# Patient Record
Sex: Female | Born: 1995 | Hispanic: No | Marital: Married | Smoking: Never smoker
Health system: Southern US, Community
[De-identification: ages and names within clinical notes are randomized; demographics above are authoritative.]

## PROBLEM LIST (undated history)

## (undated) DIAGNOSIS — Z789 Other specified health status: Secondary | ICD-10-CM

## (undated) HISTORY — DX: Other specified health status: Z78.9

## (undated) HISTORY — PX: TONSILLECTOMY: SHX5217

---

## 2019-12-20 ENCOUNTER — Encounter (HOSPITAL_COMMUNITY): Payer: Self-pay | Admitting: Physician Assistant

## 2019-12-20 ENCOUNTER — Emergency Department (HOSPITAL_COMMUNITY): Payer: Medicaid Other

## 2019-12-20 ENCOUNTER — Emergency Department (HOSPITAL_COMMUNITY)
Admission: EM | Admit: 2019-12-20 | Discharge: 2019-12-20 | Disposition: A | Discharge status: Home / Self Care | Payer: Medicaid Other | Attending: Emergency Medicine | Admitting: Emergency Medicine

## 2019-12-20 DIAGNOSIS — J029 Acute pharyngitis, unspecified: Secondary | ICD-10-CM | POA: Insufficient documentation

## 2019-12-20 DIAGNOSIS — R070 Pain in throat: Secondary | ICD-10-CM | POA: Diagnosis present

## 2019-12-20 DIAGNOSIS — Z20822 Contact with and (suspected) exposure to covid-19: Secondary | ICD-10-CM | POA: Insufficient documentation

## 2019-12-20 DIAGNOSIS — R519 Headache, unspecified: Secondary | ICD-10-CM | POA: Diagnosis not present

## 2019-12-20 DIAGNOSIS — E860 Dehydration: Secondary | ICD-10-CM | POA: Insufficient documentation

## 2019-12-20 DIAGNOSIS — R590 Localized enlarged lymph nodes: Secondary | ICD-10-CM | POA: Diagnosis not present

## 2019-12-20 DIAGNOSIS — J020 Streptococcal pharyngitis: Secondary | ICD-10-CM

## 2019-12-20 LAB — RESP PANEL BY RT-PCR (FLU A&B, COVID) ARPGX2
Influenza A by PCR: NEGATIVE
Influenza B by PCR: NEGATIVE
SARS Coronavirus 2 by RT PCR: NEGATIVE

## 2019-12-20 LAB — GROUP A STREP BY PCR: Group A Strep by PCR: DETECTED — AB

## 2019-12-20 MED ORDER — DEXAMETHASONE SODIUM PHOSPHATE 10 MG/ML IJ SOLN
10.0000 mg | Freq: Once | INTRAMUSCULAR | Status: AC
  Administered 2019-12-20: 20:00:00 10 mg via INTRAMUSCULAR
  Filled 2019-12-20: qty 1

## 2019-12-20 MED ORDER — PENICILLIN G BENZATHINE 1200000 UNIT/2ML IM SUSP
1.2000 10*6.[IU] | Freq: Once | INTRAMUSCULAR | Status: AC
  Administered 2019-12-20: 20:00:00 1.2 10*6.[IU] via INTRAMUSCULAR
  Filled 2019-12-20: qty 2

## 2019-12-20 NOTE — ED Provider Notes (Signed)
[facility] EMERGENCY DEPARTMENT Provider Note   CSN: [id number] Arrival date & time: 12/20/19  1102     History Chief Complaint  Patient presents with  . Sore Throat  . Headache  . Generalized Body Aches    Jade Stafford is a 24 y.o. female.  HPI   24 year old female presenting the emergency department today for evaluation of sore throat ongoing for the last few days.  Is complaining of painful swallowing.  She also has generalized body aches.  Denies cough.  Has had a bit of a headache  History reviewed. No pertinent past medical history.  There are no problems to display for this patient.   History reviewed. No pertinent surgical history.   OB History   No obstetric history on file.     History reviewed. No pertinent family history.     Home Medications Prior to Admission medications   Not on File    Allergies    Patient has no known allergies.  Review of Systems   Review of Systems  Constitutional: Negative for fever.  HENT: Positive for sore throat. Negative for ear pain.   Eyes: Negative for visual disturbance.  Respiratory: Negative for cough and shortness of breath.   Cardiovascular: Negative for chest pain.  Gastrointestinal: Negative for abdominal pain and vomiting.  Genitourinary: Negative for dysuria and hematuria.  Musculoskeletal: Positive for myalgias.  Skin: Negative for rash.  Neurological: Positive for headaches.  All other systems reviewed and are negative.   Physical Exam Updated Vital Signs BP 103/64   Pulse (!) 111   Temp 99.6 F (37.6 C) (Oral)   Resp 17   LMP 12/01/2019   SpO2 100%   Physical Exam Vitals and nursing note reviewed.  Constitutional:      General: She is not in acute distress.    Appearance: She is well-developed and well-nourished.  HENT:     Head: Normocephalic and atraumatic.     Mouth/Throat:     Mouth: Mucous membranes are moist.     Pharynx: Posterior oropharyngeal erythema  present. No oropharyngeal exudate.     Tonsils: No tonsillar exudate or tonsillar abscesses. 0 on the right. 0 on the left.     Comments: S/p tonsillectomy Eyes:     Conjunctiva/sclera: Conjunctivae normal.  Cardiovascular:     Rate and Rhythm: Normal rate and regular rhythm.     Heart sounds: No murmur heard.   Pulmonary:     Effort: Pulmonary effort is normal. No respiratory distress.     Breath sounds: Normal breath sounds.  Abdominal:     Palpations: Abdomen is soft.     Tenderness: There is no abdominal tenderness.  Musculoskeletal:        General: No edema.     Cervical back: Neck supple.  Skin:    General: Skin is warm and dry.  Neurological:     Mental Status: She is alert.  Psychiatric:        Mood and Affect: Mood and affect normal.     ED Results / Procedures / Treatments   Labs (all labs ordered are listed, but only abnormal results are displayed) Labs Reviewed  GROUP A STREP BY PCR - Abnormal; Notable for the following components:      Result Value   Group A Strep by PCR DETECTED (*)    All other components within normal limits  RESP PANEL BY RT-PCR (FLU A&B, COVID) [id number]    EKG None  Radiology DG  Chest Portable 1 View  Result Date: 12/20/2019 CLINICAL DATA:  Fever and shortness of breath. EXAM: PORTABLE CHEST 1 VIEW COMPARISON:  None. FINDINGS: The heart size and mediastinal contours are within normal limits. Both lungs are clear. The visualized skeletal structures are unremarkable. IMPRESSION: No active disease. Electronically Signed   By: Obie Dredge M.D.   On: 12/20/2019 12:40    Procedures Procedures (including critical care time)  Medications Ordered in ED Medications  penicillin g benzathine (BICILLIN LA) 1200000 UNIT/2ML injection 1.2 Million Units (1.2 Million Units Intramuscular Given 12/20/19 1941)  dexamethasone (DECADRON) injection 10 mg (10 mg Intramuscular Given 12/20/19 1939)    ED Course  I have reviewed the triage vital  signs and the nursing notes.  Pertinent labs & imaging results that were available during my care of the patient were reviewed by me and considered in my medical decision making (see chart for details).    MDM Rules/Calculators/A&P                          Pt febrile with pharyngeal erythe,a cervical lymphadenopathy, & dysphagia; diagnosis of strep. Treated in the Ed with steroids  and PCN IM.  covid and cxr ordered in triage are negative. Pt appears mildly dehydrated, discussed importance of water rehydration. Presentation non concerning for PTA or infxn spread to soft tissue. No trismus or uvula deviation. Specific return precautions discussed. Pt able to drink water in ED without difficulty with intact air way. Recommended PCP follow up.    Final Clinical Impression(s) / ED Diagnoses Final diagnoses:  Strep pharyngitis    Rx / DC Orders ED Discharge Orders    None       [name] 12/20/19 1950    Terrilee Files, MD 12/20/19 2317

## 2019-12-20 NOTE — ED Triage Notes (Signed)
Pt here with fever, sore throat, body aches, shob and headache onset yesterday. Denies sick contacts.

## 2019-12-20 NOTE — Discharge Instructions (Signed)

## 2020-01-21 ENCOUNTER — Ambulatory Visit: Payer: Self-pay

## 2020-02-06 ENCOUNTER — Encounter: Payer: Self-pay | Admitting: Family Medicine

## 2020-02-06 ENCOUNTER — Ambulatory Visit: Payer: Medicaid Other | Attending: Family Medicine | Admitting: Family Medicine

## 2020-02-06 ENCOUNTER — Other Ambulatory Visit: Payer: Self-pay | Admitting: Pharmacist

## 2020-02-06 ENCOUNTER — Other Ambulatory Visit: Payer: Self-pay

## 2020-02-06 VITALS — BP 99/64 | HR 75 | Wt 135.0 lb

## 2020-02-06 DIAGNOSIS — H1013 Acute atopic conjunctivitis, bilateral: Secondary | ICD-10-CM

## 2020-02-06 DIAGNOSIS — Z1159 Encounter for screening for other viral diseases: Secondary | ICD-10-CM | POA: Diagnosis not present

## 2020-02-06 MED ORDER — OLOPATADINE HCL 0.2 % OP SOLN: OPHTHALMIC | 2 refills | Status: DC

## 2020-02-06 MED ORDER — CETIRIZINE HCL 10 MG PO TABS: 10.0000 mg | ORAL_TABLET | Freq: Every day | ORAL | 1 refills | Status: DC

## 2020-02-06 MED ORDER — OLOPATADINE HCL 0.1 % OP SOLN: 1.0000 [drp] | Freq: Two times a day (BID) | OPHTHALMIC | 2 refills | Status: DC

## 2020-02-06 NOTE — Patient Instructions (Signed)
Allergic Conjunctivitis, Adult  Allergic conjunctivitis is inflammation of the clear membrane (conjunctiva) that covers the white part of your eye and the inner surface of your eyelid. This condition can make your eye red or pink. It can also make your eye feel itchy. This condition cannot be spread from one person to another person (is not contagious). What are the causes? This condition is caused by allergens. These are things that can cause an allergic reaction in some people but not in others. Common allergens include:  Outdoor allergens, such as: ? Pollen, including pollen from grass and weeds. ? Mold. ? Car fumes.  Indoor allergens, such as: ? Dust. ? Smoke. ? Mold. ? Proteins in a pet's pee (urine), saliva, or dander. What increases the risk? You are more likely to develop this condition if you have a family history of these things:  Allergies.  Conditions that you get because of allergens, such as asthma or inflammation of the skin (eczema). What are the signs or symptoms? Symptoms of this condition include eyes that are:  Itchy.  Red.  Watery.  Puffy. Your eyes may also:  Sting or burn.  Have clear fluid draining from them.  Have thick mucus coming from them. How is this treated? This condition may be treated with:  Cold, wet cloths (cold compresses) to soothe itching and swelling.  Washing the face to remove allergens.  Eye drops. These may include: ? Eye drops that block allergies. ? Eye drops that reduce swelling and irritation. ? Steroid eye drops if other treatments have not worked.  Oral antihistamine medicines. These medicines lessen your allergies. You may need these if eye drops do not help or are difficult to use.   Follow these instructions at home: Eye care  Place a cool, clean washcloth on your eye for 10-20 minutes. Do this 3-4 times a day.  Do not touch or rub your eyes.  Do not wear contact lenses until the inflammation is gone.  Wear glasses instead.  Do not wear eye makeup until the inflammation is gone. General instructions  Try not to be around things that you are allergic to.  Take or apply over-the-counter and prescription medicines only as told by your doctor. These include any eye drops.  Drink enough fluid to keep your pee pale yellow.  Keep all follow-up visits as told by your doctor. This is important. Contact a doctor if:  Your symptoms get worse.  Your symptoms do not get better with treatment.  You have mild eye pain.  You are sensitive to light.  You have spots or blisters on your eyes.  You have pus coming from your eye.  You have a fever. Get help right away if:  You have redness, swelling, or other symptoms in only one eye.  You cannot see well.  You have other vision changes.  You have very bad eye pain. Summary  Allergic conjunctivitis is caused by allergens. It can make your eye red or pink, and it can make your eye feel itchy.  This condition cannot be spread from one person to another person (is not contagious).  Try not to be around things that you are allergic to.  Take or apply over-the-counter and prescription medicines only as told by your doctor. These include any eye drops.  Contact your doctor if your symptoms get worse or they do not get better with treatment. This information is not intended to replace advice given to you by your health care provider.   Make sure you discuss any questions you have with your health care provider. Document Revised: 11/12/2018 Document Reviewed: 11/12/2018 Elsevier Patient Education  2021 Elsevier Inc.  

## 2020-02-06 NOTE — Progress Notes (Signed)
Subjective:  Patient ID: Jade Stafford, female    DOB: 07/22/1995  Age: 25 y.o. MRN: [id number]  CC: New Patient (Initial Visit)   HPI Jade Stafford is a 25 year old female who relocated from [place] in 09/2019 and presents to establish care. Complains of itching, burning and redness of her eyes on and off chronically. While she was in [place] she used eye drops. Denies visual problems but sometimes has headaches. Sometimes has infra orbital swelling and at other times has discharge from her eyes. Denies presence of rhinorrhea, sore throat or postnasal drip.  She has no sinus pressure or pain. Past Medical History:  Diagnosis Date  . No pertinent past medical history     Past Surgical History:  Procedure Laterality Date  . TONSILLECTOMY      History reviewed. No pertinent family history.  No Known Allergies  No outpatient medications prior to visit.   No facility-administered medications prior to visit.     ROS Review of Systems  Constitutional: Negative for activity change, appetite change and fatigue.  HENT: Negative for congestion, sinus pressure and sore throat.   Eyes: Positive for discharge, redness and itching. Negative for visual disturbance.  Respiratory: Negative for cough, chest tightness, shortness of breath and wheezing.   Cardiovascular: Negative for chest pain and palpitations.  Gastrointestinal: Negative for abdominal distention, abdominal pain and constipation.  Endocrine: Negative for polydipsia.  Genitourinary: Negative for dysuria and frequency.  Musculoskeletal: Negative for arthralgias and back pain.  Skin: Negative for rash.  Neurological: Negative for tremors, light-headedness and numbness.  Hematological: Does not bruise/bleed easily.  Psychiatric/Behavioral: Negative for agitation and behavioral problems.    Objective:  BP 99/64   Pulse 75   Wt 135 lb (61.2 kg)   SpO2 99%   BP/Weight 02/06/2020 12/20/2019  Systolic BP 99 101   Diastolic BP 64 66  Wt. (Lbs) 135 -      Physical Exam Constitutional:      Appearance: She is well-developed.  Eyes:     General: No scleral icterus.       Right eye: No discharge.        Left eye: No discharge.     Extraocular Movements: Extraocular movements intact.     Conjunctiva/sclera: Conjunctivae normal.     Pupils: Pupils are equal, round, and reactive to light.  Neck:     Vascular: No JVD.  Cardiovascular:     Rate and Rhythm: Normal rate.     Heart sounds: Normal heart sounds. No murmur heard.   Pulmonary:     Effort: Pulmonary effort is normal.     Breath sounds: Normal breath sounds. No wheezing or rales.  Chest:     Chest wall: No tenderness.  Abdominal:     General: Bowel sounds are normal. There is no distension.     Palpations: Abdomen is soft. There is no mass.     Tenderness: There is no abdominal tenderness.  Musculoskeletal:        General: Normal range of motion.     Right lower leg: No edema.     Left lower leg: No edema.  Neurological:     Mental Status: She is alert and oriented to person, place, and time.  Psychiatric:        Mood and Affect: Mood normal.     No flowsheet data found.  Lipid Panel  No results found for: CHOL, TRIG, HDL, CHOLHDL, VLDL, LDLCALC, LDLDIRECT  CBC No results found for:  WBC, RBC, HGB, HCT, PLT, MCV, MCH, MCHC, RDW, LYMPHSABS, MONOABS, EOSABS, BASOSABS  No results found for: HGBA1C  Assessment & Plan:  1. Allergic conjunctivitis of both eyes Symptoms are absent at this time - olopatadine (PATANOL) 0.1 % ophthalmic solution; Place 1 drop into both eyes 2 (two) times daily.  Dispense: 5 mL; Refill: 2 - Basic Metabolic Panel - CBC with Differential/Platelet - cetirizine (ZYRTEC) 10 MG tablet; Take 1 tablet (10 mg total) by mouth daily.  Dispense: 30 tablet; Refill: 1  2. Need for hepatitis C screening test - HCV RNA quant rflx ultra or genotyp([facility])   Health Care Maintenance: We will  address at next visit Meds ordered this encounter  Medications  . olopatadine (PATANOL) 0.1 % ophthalmic solution    Sig: Place 1 drop into both eyes 2 (two) times daily.    Dispense:  5 mL    Refill:  2  . cetirizine (ZYRTEC) 10 MG tablet    Sig: Take 1 tablet (10 mg total) by mouth daily.    Dispense:  30 tablet    Refill:  1    Follow-up: No follow-ups on file.       Hoy Register, MD, FAAFP. [facility] [place], [place] [phone]   02/06/2020, 11:59 AM

## 2020-02-06 NOTE — Progress Notes (Signed)
States that she is having itchy and watery eyes.

## 2020-02-07 LAB — CBC WITH DIFFERENTIAL/PLATELET
Basophils Absolute: 0.1 10*3/uL (ref 0.0–0.2)
Basos: 1 %
EOS (ABSOLUTE): 0.2 10*3/uL (ref 0.0–0.4)
Eos: 3 %
Hematocrit: 38 % (ref 34.0–46.6)
Hemoglobin: 11.9 g/dL (ref 11.1–15.9)
Immature Grans (Abs): 0 10*3/uL (ref 0.0–0.1)
Immature Granulocytes: 0 %
Lymphocytes Absolute: 2.2 10*3/uL (ref 0.7–3.1)
Lymphs: 31 %
MCH: 28.1 pg (ref 26.6–33.0)
MCHC: 31.3 g/dL — ABNORMAL LOW (ref 31.5–35.7)
MCV: 90 fL (ref 79–97)
Monocytes Absolute: 0.5 10*3/uL (ref 0.1–0.9)
Monocytes: 7 %
Neutrophils Absolute: 4.1 10*3/uL (ref 1.4–7.0)
Neutrophils: 58 %
Platelets: 273 10*3/uL (ref 150–450)
RBC: 4.24 x10E6/uL (ref 3.77–5.28)
RDW: 12.3 % (ref 11.7–15.4)
WBC: 7 10*3/uL (ref 3.4–10.8)

## 2020-02-07 LAB — BASIC METABOLIC PANEL
BUN/Creatinine Ratio: 17 (ref 9–23)
BUN: 10 mg/dL (ref 6–20)
CO2: 25 mmol/L (ref 20–29)
Calcium: 9 mg/dL (ref 8.7–10.2)
Chloride: 103 mmol/L (ref 96–106)
Creatinine, Ser: 0.58 mg/dL (ref 0.57–1.00)
GFR calc Af Amer: 148 mL/min/{1.73_m2} (ref >59)
GFR calc non Af Amer: 129 mL/min/{1.73_m2} (ref >59)
Glucose: 80 mg/dL (ref 65–99)
Potassium: 4.6 mmol/L (ref 3.5–5.2)
Sodium: 140 mmol/L (ref 134–144)

## 2020-02-07 LAB — HCV RNA QUANT RFLX ULTRA OR GENOTYP: HCV Quant Baseline: NOT DETECTED IU/mL

## 2020-02-11 ENCOUNTER — Telehealth: Payer: Self-pay

## 2020-02-11 NOTE — Telephone Encounter (Signed)
-----   Message from Hoy Register, MD sent at 02/09/2020  2:02 PM EST ----- Please inform the patient that labs are normal. Thank you.

## 2020-02-11 NOTE — Telephone Encounter (Signed)
Pt's sponsor Johnny Bridge was called and given pt lab results.

## 2020-04-27 ENCOUNTER — Encounter: Payer: Self-pay | Admitting: Family Medicine

## 2020-05-01 ENCOUNTER — Encounter: Payer: Self-pay | Admitting: Family Medicine

## 2020-05-05 ENCOUNTER — Ambulatory Visit: Payer: MEDICAID | Attending: Family Medicine | Admitting: Family Medicine

## 2020-05-05 ENCOUNTER — Other Ambulatory Visit: Payer: Self-pay

## 2020-05-05 ENCOUNTER — Other Ambulatory Visit (HOSPITAL_COMMUNITY)
Admission: RE | Admit: 2020-05-05 | Discharge: 2020-05-05 | Disposition: A | Discharge status: Home / Self Care | Payer: Medicaid Other | Source: Ambulatory Visit | Attending: Family Medicine | Admitting: Family Medicine

## 2020-05-05 ENCOUNTER — Encounter: Payer: Self-pay | Admitting: Family Medicine

## 2020-05-05 VITALS — BP 120/68 | HR 80 | Wt 127.8 lb

## 2020-05-05 DIAGNOSIS — Z124 Encounter for screening for malignant neoplasm of cervix: Secondary | ICD-10-CM

## 2020-05-05 DIAGNOSIS — Z113 Encounter for screening for infections with a predominantly sexual mode of transmission: Secondary | ICD-10-CM | POA: Diagnosis not present

## 2020-05-05 DIAGNOSIS — Z23 Encounter for immunization: Secondary | ICD-10-CM

## 2020-05-05 DIAGNOSIS — Z0000 Encounter for general adult medical examination without abnormal findings: Secondary | ICD-10-CM | POA: Diagnosis not present

## 2020-05-05 NOTE — Progress Notes (Signed)
Subjective:  Patient ID: Jade Stafford, female    DOB: [birth year]  Age: 25 y.o. MRN: [id number]  CC: Follow-up and Annual Exam   HPI Jade Stafford presents today accompanied by her SponsorJohnny Bridge. She presents for an annual physical exam and is due for a Pap smear, HPV vaccine and Tdap. She has no concerns today.  Past Medical History:  Diagnosis Date  . No pertinent past medical history     Past Surgical History:  Procedure Laterality Date  . TONSILLECTOMY      History reviewed. No pertinent family history.  No Known Allergies  Outpatient Medications Prior to Visit  Medication Sig Dispense Refill  . cetirizine (ZYRTEC) 10 MG tablet TAKE 1 TABLET (10 MG TOTAL) BY MOUTH DAILY. (Patient not taking: Reported on 05/05/2020) 30 tablet 1  . Olopatadine HCl 0.2 % SOLN PLACE 1 DROP INTO BOTH EYES 2 (TWO) TIMES DAILY. (Patient not taking: Reported on 05/05/2020) 5 mL 2   No facility-administered medications prior to visit.     ROS Review of Systems  Constitutional: Negative for activity change, appetite change and fatigue.  HENT: Negative for congestion, sinus pressure and sore throat.   Eyes: Negative for visual disturbance.  Respiratory: Negative for cough, chest tightness, shortness of breath and wheezing.   Cardiovascular: Negative for chest pain and palpitations.  Gastrointestinal: Negative for abdominal distention, abdominal pain and constipation.  Endocrine: Negative for polydipsia.  Genitourinary: Negative for dysuria and frequency.  Musculoskeletal: Negative for arthralgias and back pain.  Skin: Negative for rash.  Neurological: Negative for tremors, light-headedness and numbness.  Hematological: Does not bruise/bleed easily.  Psychiatric/Behavioral: Negative for agitation and behavioral problems.    Objective:  BP 120/68   Pulse 80   Wt 127 lb 12.8 oz (58 kg)   SpO2 98%   BP/Weight 05/05/2020 02/06/2020 12/20/2019  Systolic BP 120 99 101  Diastolic BP 68 64 66   Wt. (Lbs) 127.8 135 -      Physical Exam Constitutional:      Appearance: She is well-developed.  Neck:     Vascular: No JVD.  Cardiovascular:     Rate and Rhythm: Normal rate.     Heart sounds: Normal heart sounds. No murmur heard.   Pulmonary:     Effort: Pulmonary effort is normal.     Breath sounds: Normal breath sounds. No wheezing or rales.  Chest:     Chest wall: No tenderness.  Abdominal:     General: Bowel sounds are normal. There is no distension.     Palpations: Abdomen is soft. There is no mass.     Tenderness: There is no abdominal tenderness.  Genitourinary:    Comments: External genitalia, vagina, cervix, adnexa-normal Musculoskeletal:        General: Normal range of motion.     Right lower leg: No edema.     Left lower leg: No edema.  Neurological:     Mental Status: She is alert and oriented to person, place, and time.  Psychiatric:        Mood and Affect: Mood normal.     CMP Latest Ref Rng & Units 02/06/2020  Glucose 65 - 99 mg/dL 80  BUN 6 - 20 mg/dL 10  Creatinine 3.47 - 4.25 mg/dL 9.56  Sodium 387 - 564 mmol/L 140  Potassium 3.5 - 5.2 mmol/L 4.6  Chloride 96 - 106 mmol/L 103  CO2 20 - 29 mmol/L 25  Calcium 8.7 - 10.2 mg/dL 9.0  Lipid Panel  No results found for: CHOL, TRIG, HDL, CHOLHDL, VLDL, LDLCALC, LDLDIRECT  CBC    Component Value Date/Time   WBC 7.0 02/06/2020 1208   RBC 4.24 02/06/2020 1208   HGB 11.9 02/06/2020 1208   HCT 38.0 02/06/2020 1208   PLT 273 02/06/2020 1208   MCV 90 02/06/2020 1208   MCH 28.1 02/06/2020 1208   MCHC 31.3 (L) 02/06/2020 1208   RDW 12.3 02/06/2020 1208   LYMPHSABS 2.2 02/06/2020 1208   EOSABS 0.2 02/06/2020 1208   BASOSABS 0.1 02/06/2020 1208    No results found for: HGBA1C  Assessment & Plan:  1. Annual physical exam Counseled on 150 minutes of exercise per week, healthy eating (including decreased daily intake of saturated fats, cholesterol, added sugars, sodium), STI prevention, routine  healthcare maintenance. Tdap and HPV vaccines administered today.  2. Screening for cervical cancer - Cytology - PAP([place])  3. Screening for STD (sexually transmitted disease) - Cervicovaginal ancillary only    No orders of the defined types were placed in this encounter.   Follow-up: Return in about 2 months (around 07/05/2020) for Second dose of HPV on 07/07/2020, third dose of HPV vaccine on 11/10/20. PCP 1 year.       Hoy Register, MD, FAAFP. [facility] [place], [place] [phone]   05/05/2020, 4:01 PM

## 2020-05-05 NOTE — Patient Instructions (Signed)
Health Maintenance, Female Adopting a healthy lifestyle and getting preventive care are important in promoting health and wellness. Ask your health care provider about:  The right schedule for you to have regular tests and exams.  Things you can do on your own to prevent diseases and keep yourself healthy. What should I know about diet, weight, and exercise? Eat a healthy diet  Eat a diet that includes plenty of vegetables, fruits, low-fat dairy products, and lean protein.  Do not eat a lot of foods that are high in solid fats, added sugars, or sodium.   Maintain a healthy weight Body mass index (BMI) is used to identify weight problems. It estimates body fat based on height and weight. Your health care provider can help determine your BMI and help you achieve or maintain a healthy weight. Get regular exercise Get regular exercise. This is one of the most important things you can do for your health. Most adults should:  Exercise for at least 150 minutes each week. The exercise should increase your heart rate and make you sweat (moderate-intensity exercise).  Do strengthening exercises at least twice a week. This is in addition to the moderate-intensity exercise.  Spend less time sitting. Even light physical activity can be beneficial. Watch cholesterol and blood lipids Have your blood tested for lipids and cholesterol at 26 years of age, then have this test every 5 years. Have your cholesterol levels checked more often if:  Your lipid or cholesterol levels are high.  You are older than 26 years of age.  You are at high risk for heart disease. What should I know about cancer screening? Depending on your health history and family history, you may need to have cancer screening at various ages. This may include screening for:  Breast cancer.  Cervical cancer.  Colorectal cancer.  Skin cancer.  Lung cancer. What should I know about heart disease, diabetes, and high blood  pressure? Blood pressure and heart disease  High blood pressure causes heart disease and increases the risk of stroke. This is more likely to develop in people who have high blood pressure readings, are of African descent, or are overweight.  Have your blood pressure checked: ? Every 3-5 years if you are 18-39 years of age. ? Every year if you are 40 years old or older. Diabetes Have regular diabetes screenings. This checks your fasting blood sugar level. Have the screening done:  Once every three years after age 40 if you are at a normal weight and have a low risk for diabetes.  More often and at a younger age if you are overweight or have a high risk for diabetes. What should I know about preventing infection? Hepatitis B If you have a higher risk for hepatitis B, you should be screened for this virus. Talk with your health care provider to find out if you are at risk for hepatitis B infection. Hepatitis C Testing is recommended for:  Everyone born from 1945 through 1965.  Anyone with known risk factors for hepatitis C. Sexually transmitted infections (STIs)  Get screened for STIs, including gonorrhea and chlamydia, if: ? You are sexually active and are younger than 26 years of age. ? You are older than 26 years of age and your health care provider tells you that you are at risk for this type of infection. ? Your sexual activity has changed since you were last screened, and you are at increased risk for chlamydia or gonorrhea. Ask your health care provider   if you are at risk.  Ask your health care provider about whether you are at high risk for HIV. Your health care provider may recommend a prescription medicine to help prevent HIV infection. If you choose to take medicine to prevent HIV, you should first get tested for HIV. You should then be tested every 3 months for as long as you are taking the medicine. Pregnancy  If you are about to stop having your period (premenopausal) and  you may become pregnant, seek counseling before you get pregnant.  Take 400 to 800 micrograms (mcg) of folic acid every day if you become pregnant.  Ask for birth control (contraception) if you want to prevent pregnancy. Osteoporosis and menopause Osteoporosis is a disease in which the bones lose minerals and strength with aging. This can result in bone fractures. If you are 65 years old or older, or if you are at risk for osteoporosis and fractures, ask your health care provider if you should:  Be screened for bone loss.  Take a calcium or vitamin D supplement to lower your risk of fractures.  Be given hormone replacement therapy (HRT) to treat symptoms of menopause. Follow these instructions at home: Lifestyle  Do not use any products that contain nicotine or tobacco, such as cigarettes, e-cigarettes, and chewing tobacco. If you need help quitting, ask your health care provider.  Do not use street drugs.  Do not share needles.  Ask your health care provider for help if you need support or information about quitting drugs. Alcohol use  Do not drink alcohol if: ? Your health care provider tells you not to drink. ? You are pregnant, may be pregnant, or are planning to become pregnant.  If you drink alcohol: ? Limit how much you use to 0-1 drink a day. ? Limit intake if you are breastfeeding.  Be aware of how much alcohol is in your drink. In the [place] one drink equals one 12 oz bottle of beer (355 mL), one 5 oz glass of wine (148 mL), or one 1 oz glass of hard liquor (44 mL). General instructions  Schedule regular health, dental, and eye exams.  Stay current with your vaccines.  Tell your health care provider if: ? You often feel depressed. ? You have ever been abused or do not feel safe at home. Summary  Adopting a healthy lifestyle and getting preventive care are important in promoting health and wellness.  Follow your health care provider's instructions about healthy  diet, exercising, and getting tested or screened for diseases.  Follow your health care provider's instructions on monitoring your cholesterol and blood pressure. This information is not intended to replace advice given to you by your health care provider. Make sure you discuss any questions you have with your health care provider. Document Revised: 12/13/2017 Document Reviewed: 12/13/2017 Elsevier Patient Education  2021 Elsevier Inc.  

## 2020-05-06 ENCOUNTER — Other Ambulatory Visit: Payer: Self-pay

## 2020-05-06 ENCOUNTER — Other Ambulatory Visit: Payer: Self-pay | Admitting: Family Medicine

## 2020-05-06 LAB — CERVICOVAGINAL ANCILLARY ONLY
Bacterial Vaginitis (gardnerella): POSITIVE — AB
Candida Glabrata: NEGATIVE
Candida Vaginitis: NEGATIVE
Chlamydia: NEGATIVE
Comment: NEGATIVE
Comment: NEGATIVE
Comment: NEGATIVE
Comment: NEGATIVE
Comment: NEGATIVE
Comment: NORMAL
Neisseria Gonorrhea: NEGATIVE
Trichomonas: NEGATIVE

## 2020-05-06 LAB — CYTOLOGY - PAP: Diagnosis: NEGATIVE

## 2020-05-06 MED ORDER — METRONIDAZOLE 0.75 % VA GEL
1.0000 | Freq: Every day | VAGINAL | 0 refills | Status: DC
  Filled 2020-05-06: qty 70, 7d supply, fill #0

## 2020-05-11 ENCOUNTER — Encounter: Payer: Medicaid Other | Admitting: Family Medicine

## 2020-05-11 ENCOUNTER — Other Ambulatory Visit: Payer: Self-pay

## 2020-05-22 ENCOUNTER — Encounter: Payer: Medicaid Other | Admitting: Family Medicine

## 2020-06-08 ENCOUNTER — Ambulatory Visit (INDEPENDENT_AMBULATORY_CARE_PROVIDER_SITE_OTHER): Payer: Medicaid Other

## 2020-06-08 ENCOUNTER — Other Ambulatory Visit: Payer: Self-pay

## 2020-06-08 VITALS — BP 102/68 | HR 78 | Wt 125.4 lb

## 2020-06-08 DIAGNOSIS — Z3201 Encounter for pregnancy test, result positive: Secondary | ICD-10-CM

## 2020-06-08 LAB — POCT PREGNANCY, URINE: Preg Test, Ur: POSITIVE — AB

## 2020-06-08 MED ORDER — PREPLUS 27-1 MG PO TABS: 1.0000 | ORAL_TABLET | Freq: Every day | ORAL | 8 refills | Status: DC

## 2020-06-08 MED ORDER — PROMETHAZINE HCL 25 MG PO TABS: 25.0000 mg | ORAL_TABLET | Freq: Four times a day (QID) | ORAL | 1 refills | Status: DC | PRN

## 2020-06-08 MED ORDER — ONDANSETRON 4 MG PO TBDP: 4.0000 mg | ORAL_TABLET | Freq: Three times a day (TID) | ORAL | 0 refills | Status: DC | PRN

## 2020-06-08 MED ORDER — METOCLOPRAMIDE HCL 5 MG PO TABS: 5.0000 mg | ORAL_TABLET | Freq: Three times a day (TID) | ORAL | 1 refills | Status: DC

## 2020-06-08 NOTE — Progress Notes (Signed)
Pt here today for UPT. UPT in office was positive.   LMP 04/25/2020 EDD: 01/30/2021 [redacted]w[redacted]d  Pt states having lots of nausea x 1 week. Pt is not taking any medications. Pt advised to take medications given on safe meds list. Pt agreeable. Pt also states having heart burn/acid reflux. Pt advised can take Tums or Pepcid as needed. Pt agreeable.  Pt also not taking PNV. Pt needs Rx. Rx sent to pharmacy on file. Pt to make new OB intake appt at checkout. Pt verbalized understanding.   Dr Alvester Morin to come and see pt. Rx given for Reglan, Phenergan and Zofran.   Judeth Cornfield, RN

## 2020-06-08 NOTE — Progress Notes (Signed)
Attestation of Attending Supervision of clinical support staff: I agree with the care provided to this patient and was available for any consultation.  I have reviewed the RN's note and chart. I was available and came to see the patient.    #Nausea/Vomiting: Patient has been having 4-5 episodes of vomiting. Rx for antiemetics sent.   Future Appointments  Date Time Provider Department Center  07/02/2020 11:15 AM [facility] INTAKE [facility]  07/07/2020  3:00 PM [facility] NURSE [facility] None  07/14/2020  8:55 AM Marylene Land, CNM [facility]  11/10/2020  3:00 PM [facility] NURSE [facility] None     Federico Flake, MD, MPH, ABFM Attending Physician Faculty Practice- Center for [facility]

## 2020-06-17 ENCOUNTER — Inpatient Hospital Stay (HOSPITAL_COMMUNITY)
Admission: AD | Admit: 2020-06-17 | Discharge: 2020-06-18 | Disposition: A | Discharge status: Home / Self Care | Payer: Medicaid Other | Attending: Obstetrics & Gynecology | Admitting: Obstetrics & Gynecology

## 2020-06-17 ENCOUNTER — Inpatient Hospital Stay (HOSPITAL_COMMUNITY): Payer: Medicaid Other

## 2020-06-17 ENCOUNTER — Encounter (HOSPITAL_COMMUNITY): Payer: Self-pay | Admitting: Obstetrics & Gynecology

## 2020-06-17 DIAGNOSIS — Z3A08 8 weeks gestation of pregnancy: Secondary | ICD-10-CM | POA: Insufficient documentation

## 2020-06-17 DIAGNOSIS — M545 Low back pain, unspecified: Secondary | ICD-10-CM | POA: Diagnosis not present

## 2020-06-17 DIAGNOSIS — O211 Hyperemesis gravidarum with metabolic disturbance: Secondary | ICD-10-CM | POA: Diagnosis not present

## 2020-06-17 DIAGNOSIS — O210 Mild hyperemesis gravidarum: Secondary | ICD-10-CM | POA: Diagnosis present

## 2020-06-17 DIAGNOSIS — E860 Dehydration: Secondary | ICD-10-CM

## 2020-06-17 DIAGNOSIS — O26891 Other specified pregnancy related conditions, first trimester: Secondary | ICD-10-CM

## 2020-06-17 DIAGNOSIS — O3680X0 Pregnancy with inconclusive fetal viability, not applicable or unspecified: Secondary | ICD-10-CM

## 2020-06-17 DIAGNOSIS — Z603 Acculturation difficulty: Secondary | ICD-10-CM

## 2020-06-17 DIAGNOSIS — O26899 Other specified pregnancy related conditions, unspecified trimester: Secondary | ICD-10-CM

## 2020-06-17 DIAGNOSIS — O219 Vomiting of pregnancy, unspecified: Secondary | ICD-10-CM

## 2020-06-17 LAB — URINALYSIS, MICROSCOPIC (REFLEX): Bacteria, UA: NONE SEEN

## 2020-06-17 LAB — CBC
HCT: 38.6 % (ref 36.0–46.0)
Hemoglobin: 13.2 g/dL (ref 12.0–15.0)
MCH: 29.6 pg (ref 26.0–34.0)
MCHC: 34.2 g/dL (ref 30.0–36.0)
MCV: 86.5 fL (ref 80.0–100.0)
Platelets: 277 10*3/uL (ref 150–400)
RBC: 4.46 MIL/uL (ref 3.87–5.11)
RDW: 12.1 % (ref 11.5–15.5)
WBC: 10.1 10*3/uL (ref 4.0–10.5)
nRBC: 0 % (ref 0.0–0.2)

## 2020-06-17 LAB — URINALYSIS, ROUTINE W REFLEX MICROSCOPIC
Bilirubin Urine: NEGATIVE
Glucose, UA: 50 mg/dL — AB
Hgb urine dipstick: NEGATIVE
Ketones, ur: 20 mg/dL — AB
Leukocytes,Ua: NEGATIVE
Nitrite: NEGATIVE
Protein, ur: 30 mg/dL — AB
Specific Gravity, Urine: 1.021 (ref 1.005–1.030)
pH: 7 (ref 5.0–8.0)

## 2020-06-17 LAB — HCG, QUANTITATIVE, PREGNANCY: hCG, Beta Chain, Quant, S: 225640 m[IU]/mL — ABNORMAL HIGH (ref <5)

## 2020-06-17 MED ORDER — SODIUM CHLORIDE 0.9 % IV SOLN: Freq: Once | INTRAVENOUS | Status: AC

## 2020-06-17 MED ORDER — DEXAMETHASONE SODIUM PHOSPHATE 10 MG/ML IJ SOLN
10.0000 mg | Freq: Once | INTRAMUSCULAR | Status: AC
  Administered 2020-06-17: 22:00:00 10 mg via INTRAVENOUS
  Filled 2020-06-17: qty 1

## 2020-06-17 MED ORDER — FAMOTIDINE IN NACL 20-0.9 MG/50ML-% IV SOLN
20.0000 mg | Freq: Once | INTRAVENOUS | Status: AC
  Administered 2020-06-17: 21:00:00 20 mg via INTRAVENOUS
  Filled 2020-06-17: qty 50

## 2020-06-17 MED ORDER — PANTOPRAZOLE SODIUM 20 MG PO TBEC: 20.0000 mg | DELAYED_RELEASE_TABLET | Freq: Every day | ORAL | 1 refills | Status: DC

## 2020-06-17 MED ORDER — SODIUM CHLORIDE 0.9 % IV SOLN
12.5000 mg | Freq: Once | INTRAVENOUS | Status: AC
  Administered 2020-06-17: 22:00:00 12.5 mg via INTRAVENOUS
  Filled 2020-06-17: qty 0.5

## 2020-06-17 NOTE — MAU Note (Addendum)
Nausea and vomiting for 2 wks. Taking Reglan and Zofran and not helping. Some bilateral back pain. No VB. Also has headache.

## 2020-06-17 NOTE — MAU Provider Note (Signed)
Chief Complaint: Nausea and Emesis During Pregnancy   Event Date/Time   First Provider Initiated Contact with Patient 06/17/20 2057        SUBJECTIVE HPI: Jade Stafford is a 25 y.o. G1P0 at [redacted]w[redacted]d by LMP who presents to maternity admissions reporting nausea and vomiting despite taking the meds she was prescribed. States vomits after taking them.  Last dose today was 10am. Has intermittent low back pain. . She denies vaginal bleeding, vaginal itching/burning, urinary symptoms, h/a, dizziness, or fever/chills.    Interpretor used (Dari)  Emesis  This is a recurrent problem. The current episode started 1 to 4 weeks ago. The problem has been unchanged. There has been no fever. Associated symptoms include headaches. Pertinent negatives include no abdominal pain, chills, diarrhea, dizziness, fever or myalgias. Treatments tried: reglan and zofran.   RN Note: Nausea and vomiting for 2 wks. Taking Reglan and Zofran and not helping. Some bilateral back pain. No VB. Also has headache.   Past Medical History:  Diagnosis Date   No pertinent past medical history    Past Surgical History:  Procedure Laterality Date   TONSILLECTOMY     Social History   Socioeconomic History   Marital status: Married    Spouse name: Not on file   Number of children: Not on file   Years of education: Not on file   Highest education level: Not on file  Occupational History   Not on file  Tobacco Use   Smoking status: Never   Smokeless tobacco: Never  Substance and Sexual Activity   Alcohol use: Never   Drug use: Never   Sexual activity: Not on file  Other Topics Concern   Not on file  Social History Narrative   Not on file   Social Determinants of Health   Financial Resource Strain: Not on file  Food Insecurity: Not on file  Transportation Needs: Not on file  Physical Activity: Not on file  Stress: Not on file  Social Connections: Not on file  Intimate Partner Violence: Not on file   No current  facility-administered medications on file prior to encounter.   Current Outpatient Medications on File Prior to Encounter  Medication Sig Dispense Refill   metoCLOPramide (REGLAN) 5 MG tablet Take 1 tablet (5 mg total) by mouth 3 (three) times daily before meals. 120 tablet 1   ondansetron (ZOFRAN ODT) 4 MG disintegrating tablet Take 1 tablet (4 mg total) by mouth every 8 (eight) hours as needed for nausea or vomiting. 20 tablet 0   Prenatal Vit-Fe Fumarate-FA (PREPLUS) 27-1 MG TABS Take 1 tablet by mouth daily. 30 tablet 8   promethazine (PHENERGAN) 25 MG tablet Take 1 tablet (25 mg total) by mouth every 6 (six) hours as needed for nausea or vomiting. 30 tablet 1   No Known Allergies  I have reviewed patient's Past Medical Hx, Surgical Hx, Family Hx, Social Hx, medications and allergies.   ROS:  Review of Systems  Constitutional:  Negative for chills and fever.  Gastrointestinal:  Positive for vomiting. Negative for abdominal pain and diarrhea.  Musculoskeletal:  Negative for myalgias.  Neurological:  Positive for headaches. Negative for dizziness.  Review of Systems  Other systems negative   Physical Exam  Physical Exam Patient Vitals for the past 24 hrs:  BP Temp Pulse Resp SpO2 Height Weight  06/17/20 2038 (!) 101/58 98.1 F (36.7 C) 62 16 100 % 5' (1.524 m) 55.8 kg   Constitutional: Well-developed, well-nourished female in no acute  distress.  Cardiovascular: normal rate Respiratory: normal effort GI: Abd soft, non-tender.  MS: Extremities nontender, no edema, normal ROM Neurologic: Alert and oriented x 4.  GU: Neg CVAT.  PELVIC EXAM: deferred  LAB RESULTS Results for orders placed or performed during the hospital encounter of 06/17/20 (from the past 24 hour(s))  Urinalysis, Routine w reflex microscopic Urine, Clean Catch     Status: Abnormal   Collection Time: 06/17/20  9:11 PM  Result Value Ref Range   Color, Urine YELLOW YELLOW   APPearance TURBID (A) CLEAR    Specific Gravity, Urine 1.021 1.005 - 1.030   pH 7.0 5.0 - 8.0   Glucose, UA 50 (A) NEGATIVE mg/dL   Hgb urine dipstick NEGATIVE NEGATIVE   Bilirubin Urine NEGATIVE NEGATIVE   Ketones, ur 20 (A) NEGATIVE mg/dL   Protein, ur 30 (A) NEGATIVE mg/dL   Nitrite NEGATIVE NEGATIVE   Leukocytes,Ua NEGATIVE NEGATIVE  Urinalysis, Microscopic (reflex)     Status: None   Collection Time: 06/17/20  9:11 PM  Result Value Ref Range   RBC / HPF 0-5 0 - 5 RBC/hpf   WBC, UA 0-5 0 - 5 WBC/hpf   Bacteria, UA NONE SEEN NONE SEEN   Squamous Epithelial / LPF 0-5 0 - 5   Mucus PRESENT   CBC     Status: None   Collection Time: 06/17/20  9:25 PM  Result Value Ref Range   WBC 10.1 4.0 - 10.5 K/uL   RBC 4.46 3.87 - 5.11 MIL/uL   Hemoglobin 13.2 12.0 - 15.0 g/dL   HCT 67.8 93.8 - 10.1 %   MCV 86.5 80.0 - 100.0 fL   MCH 29.6 26.0 - 34.0 pg   MCHC 34.2 30.0 - 36.0 g/dL   RDW 75.1 02.5 - 85.2 %   Platelets 277 150 - 400 K/uL   nRBC 0.0 0.0 - 0.2 %  hCG, quantitative, pregnancy     Status: Abnormal   Collection Time: 06/17/20  9:25 PM  Result Value Ref Range   hCG, Beta Chain, Quant, S 225,640 (H) <5 mIU/mL    IMAGING US OB Comp Less 14 Wks  Result Date: 06/17/2020 CLINICAL DATA:  25 year old pregnant female with back pain. LMP: 04/25/2020 corresponding to an estimated gestational age of [redacted] weeks, 4 days. EXAM: OBSTETRIC <14 WK ULTRASOUND TECHNIQUE: Transabdominal ultrasound was performed for evaluation of the gestation as well as the maternal uterus and adnexal regions. COMPARISON:  None. FINDINGS: Intrauterine gestational sac: Single intrauterine gestational sac. Yolk sac:  Seen Embryo:  Present Cardiac Activity: Detected Heart Rate: 171 bpm CRL: 17 mm   8 w 1 d                  [place] EDC: 01/26/2021 Subchorionic hemorrhage:  None visualized. Maternal uterus/adnexae: The maternal ovaries are unremarkable. IMPRESSION: Single live intrauterine pregnancy with an estimated gestational age of [redacted] weeks, 1 day.  Electronically Signed   By: Elgie Collard M.D.   On: 06/17/2020 22:56     MAU Management/MDM: Ordered usual first trimester r/o ectopic labs.   Pelvic cultures done on urine.  Will check baseline Ultrasound to rule out ectopic. >> showed single live fetus at [redacted]w[redacted]d  This bleeding/pain can represent a normal pregnancy with bleeding, spontaneous abortion or even an ectopic which can be life-threatening.  The process as listed above helps to determine which of these is present.  IV fluids given for rehydration - 2 liters Phenergan, Decadron, Pepcid given for nausea  ASSESSMENT Single IUP at [redacted]w[redacted]d Nausea and vomiting Dehydration Low back pain in early pregnancy Pregnancy of [place] location > confirmed IUP  PLAN Discharge home Has several prescriptions for nausea Encouraged to take them as ordered Added Rx for Protonix for acid reduction Advance diet as tolerated  Pt stable at time of discharge. Encouraged to return here if she develops worsening of symptoms, increase in pain, fever, or other concerning symptoms.    Wynelle Bourgeois CNM, MSN Certified Nurse-Midwife 06/17/2020  8:57 PM

## 2020-06-18 ENCOUNTER — Encounter (HOSPITAL_COMMUNITY): Payer: Self-pay | Admitting: Obstetrics & Gynecology

## 2020-06-18 DIAGNOSIS — M545 Low back pain, unspecified: Secondary | ICD-10-CM

## 2020-06-18 DIAGNOSIS — O26891 Other specified pregnancy related conditions, first trimester: Secondary | ICD-10-CM

## 2020-06-18 DIAGNOSIS — Z603 Acculturation difficulty: Secondary | ICD-10-CM

## 2020-06-22 ENCOUNTER — Other Ambulatory Visit: Payer: Self-pay

## 2020-06-23 MED ORDER — ONDANSETRON 4 MG PO TBDP: 4.0000 mg | ORAL_TABLET | Freq: Three times a day (TID) | ORAL | 0 refills | Status: DC | PRN

## 2020-06-23 NOTE — Telephone Encounter (Signed)
Reviewed with Lodema Hong, CNM who gives approval for #20 tablets. Pt to follow up at OB intake. Will discuss other forms of nausea relief if no improvement.

## 2020-07-02 ENCOUNTER — Telehealth (INDEPENDENT_AMBULATORY_CARE_PROVIDER_SITE_OTHER): Payer: Medicaid Other

## 2020-07-02 ENCOUNTER — Encounter: Payer: Self-pay | Admitting: Family Medicine

## 2020-07-02 ENCOUNTER — Other Ambulatory Visit: Payer: Self-pay

## 2020-07-02 DIAGNOSIS — Z136 Encounter for screening for cardiovascular disorders: Secondary | ICD-10-CM

## 2020-07-02 DIAGNOSIS — Z3480 Encounter for supervision of other normal pregnancy, unspecified trimester: Secondary | ICD-10-CM

## 2020-07-02 DIAGNOSIS — R110 Nausea: Secondary | ICD-10-CM

## 2020-07-02 DIAGNOSIS — Z3A00 Weeks of gestation of pregnancy not specified: Secondary | ICD-10-CM

## 2020-07-02 MED ORDER — ONDANSETRON HCL 4 MG PO TABS: 4.0000 mg | ORAL_TABLET | Freq: Three times a day (TID) | ORAL | 3 refills | Status: DC | PRN

## 2020-07-02 MED ORDER — BLOOD PRESSURE MONITORING DEVI: 1.0000 | 0 refills | Status: DC

## 2020-07-02 NOTE — Progress Notes (Signed)
New OB Intake  I connected with  Jade Stafford on 07/02/20 at 11:15 AM EDT by telephone Video Visit and verified that I am speaking with the correct person using two identifiers. Nurse is located at [facility] and pt is located at home.  I discussed the limitations, risks, security and privacy concerns of performing an evaluation and management service by telephone and the availability of in person appointments. I also discussed with the patient that there may be a patient responsible charge related to this service. The patient expressed understanding and agreed to proceed.  I explained I am completing New OB Intake today. We discussed her EDD of 01/26/21 that is based on U/S 06/17/20, was [redacted]w[redacted]d. Pt is G3/P2. I reviewed her allergies, medications, Medical/Surgical/OB history, and appropriate screenings. I informed her of [facility] services. Based on history, this is a/an  pregnancy uncomplicated .   Patient Active Problem List   Diagnosis Date Noted   Language barrier, cultural differences 06/18/2020   Low back pain during pregnancy in first trimester 06/18/2020    Concerns addressed today  Delivery Plans:  Plans to deliver at [facility].   MyChart/Babyscripts MyChart access verified. I explained pt will have some visits in office and some virtually. Babyscripts instructions given and order placed. Patient verifies receipt of registration text/e-mail. Account successfully created and app downloaded.  Blood Pressure Cuff  Blood pressure cuff ordered for patient to pick-up from [employer]. Explained after first prenatal appt pt will check weekly and document in Babyscripts.  Weight scale: Patient    have weight scale. Weight scale ordered   Anatomy US Explained first scheduled [place] will be around 19 weeks. Anatomy US scheduled for 09/01/20 at 09:30a. Pt notified to arrive at 09:15a.  Labs Discussed Jade Stafford genetic screening with patient. Would like both Panorama and Horizon drawn at new OB  visit. Routine prenatal labs needed.  Covid Vaccine Patient has covid vaccine.   Mother/ Baby Dyad Candidate?    If yes, offer as possibility  Inform patient of [facility] and place . In AVS   Social Determinants of Health Food Insecurity: Patient denies food insecurity. WIC Referral: Patient is interested in referral to [facility].  Transportation: Patient denies transportation needs. Childcare: Discussed no children allowed at ultrasound appointments. Offered childcare services; patient declines childcare services at this time.  First visit review I reviewed new OB appt with pt. I explained she will have a pelvic exam, ob bloodwork with genetic screening, and PAP smear. Explained pt will be seen by Luna Kitchens, CNM at first visit; encounter routed to appropriate provider. Explained that patient will be seen by pregnancy navigator following visit with provider. [facility] information placed in AVS.   Jade Stafford, CMA 07/02/2020  11:51 AM

## 2020-07-07 ENCOUNTER — Ambulatory Visit: Payer: Medicaid Other

## 2020-07-14 ENCOUNTER — Ambulatory Visit (INDEPENDENT_AMBULATORY_CARE_PROVIDER_SITE_OTHER): Payer: Medicaid Other | Admitting: Student

## 2020-07-14 ENCOUNTER — Other Ambulatory Visit: Payer: Self-pay

## 2020-07-14 ENCOUNTER — Other Ambulatory Visit (HOSPITAL_COMMUNITY)
Admission: RE | Admit: 2020-07-14 | Discharge: 2020-07-14 | Disposition: A | Discharge status: Home / Self Care | Payer: Medicaid Other | Source: Ambulatory Visit | Attending: Student | Admitting: Student

## 2020-07-14 VITALS — BP 94/59 | HR 78 | Wt 126.1 lb

## 2020-07-14 DIAGNOSIS — Z3480 Encounter for supervision of other normal pregnancy, unspecified trimester: Secondary | ICD-10-CM | POA: Insufficient documentation

## 2020-07-14 DIAGNOSIS — Z3A12 12 weeks gestation of pregnancy: Secondary | ICD-10-CM | POA: Diagnosis not present

## 2020-07-14 NOTE — Progress Notes (Signed)
  Subjective:    Jade Stafford is being seen today for her first obstetrical visit.  This is a planned pregnancy. She is at [redacted]w[redacted]d gestation. Her obstetrical history is significant for nothing. Relationship with FOB: spouse, living together. Patient does intend to breast feed. Pregnancy history fully reviewed. She denies c/sections in the past; no bleeding after delivery, denies history of blood sugar or blood pressure issues in the past.  "Everything was normal except morning sickness, which is normal". She is taking medicine for the nausea.  Patient reports nausea and lack of appetite  Review of Systems:   Review of Systems  Constitutional:  Positive for appetite change.  Respiratory: Negative.    Cardiovascular: Negative.   Genitourinary: Negative.   Neurological: Negative.    Objective:     BP (!) 94/59   Pulse 78   Wt 126 lb 1.6 oz (57.2 kg)   LMP 04/25/2020 (Exact Date)   BMI 24.63 kg/m  Physical Exam Constitutional:      Appearance: Normal appearance.  Pulmonary:     Effort: Pulmonary effort is normal.  Musculoskeletal:        General: Normal range of motion.     Cervical back: Normal range of motion.  Skin:    General: Skin is warm.  Neurological:     General: No focal deficit present.     Mental Status: She is alert.    Exam    Assessment:    Pregnancy: [id number] Patient Active Problem List   Diagnosis Date Noted  . Supervision of other normal pregnancy, antepartum 07/02/2020  . Language barrier, cultural differences 06/18/2020  . Low back pain during pregnancy in first trimester 06/18/2020       Plan:     Initial labs drawn. Prenatal vitamins. Problem list reviewed and updated. AFP3 discussed:  will do after 15 weeks . Role of ultrasound in pregnancy discussed; fetal survey: ordered. Amniocentesis discussed: not indicated. Follow up in 4 weeks. 75% of 30 min visit spent on counseling and coordination of care.  -patient reports that phenergan is  helping her with nausea -welcomed patient to practice; patient would prefer only female providers -interpreter Gust Brooms # [phone] -no pap today; patient had in May -will do genetic testing today, explained to patient how to check results using card -confirmed refills of prenatal and pheneragn [facility] 07/14/2020

## 2020-07-15 LAB — CBC/D/PLT+RPR+RH+ABO+RUBIGG...
Antibody Screen: NEGATIVE
Basophils Absolute: 0 10*3/uL (ref 0.0–0.2)
Basos: 0 %
EOS (ABSOLUTE): 0.1 10*3/uL (ref 0.0–0.4)
Eos: 1 %
HCV Ab: <0.1 s/co ratio (ref 0.0–0.9)
HIV Screen 4th Generation wRfx: NONREACTIVE
Hematocrit: 37.2 % (ref 34.0–46.6)
Hemoglobin: 12.1 g/dL (ref 11.1–15.9)
Hepatitis B Surface Ag: NEGATIVE
Immature Grans (Abs): 0 10*3/uL (ref 0.0–0.1)
Immature Granulocytes: 0 %
Lymphocytes Absolute: 1.5 10*3/uL (ref 0.7–3.1)
Lymphs: 19 %
MCH: 28.9 pg (ref 26.6–33.0)
MCHC: 32.5 g/dL (ref 31.5–35.7)
MCV: 89 fL (ref 79–97)
Monocytes Absolute: 0.4 10*3/uL (ref 0.1–0.9)
Monocytes: 5 %
Neutrophils Absolute: 6 10*3/uL (ref 1.4–7.0)
Neutrophils: 75 %
Platelets: 271 10*3/uL (ref 150–450)
RBC: 4.19 x10E6/uL (ref 3.77–5.28)
RDW: 12.6 % (ref 11.7–15.4)
RPR Ser Ql: NONREACTIVE
Rh Factor: POSITIVE
Rubella Antibodies, IGG: 14.2 index (ref >0.99)
WBC: 8 10*3/uL (ref 3.4–10.8)

## 2020-07-15 LAB — HCV INTERPRETATION

## 2020-07-15 LAB — GC/CHLAMYDIA PROBE AMP (~~LOC~~) NOT AT ARMC
Chlamydia: NEGATIVE
Comment: NEGATIVE
Comment: NORMAL
Neisseria Gonorrhea: NEGATIVE

## 2020-07-15 LAB — HEMOGLOBIN A1C
Est. average glucose Bld gHb Est-mCnc: 100 mg/dL
Hgb A1c MFr Bld: 5.1 % (ref 4.8–5.6)

## 2020-07-16 LAB — CULTURE, OB URINE

## 2020-07-16 LAB — URINE CULTURE, OB REFLEX

## 2020-08-06 ENCOUNTER — Encounter: Payer: Self-pay | Admitting: *Deleted

## 2020-08-11 ENCOUNTER — Ambulatory Visit (INDEPENDENT_AMBULATORY_CARE_PROVIDER_SITE_OTHER): Payer: Medicaid Other | Admitting: Nurse Practitioner

## 2020-08-11 ENCOUNTER — Other Ambulatory Visit: Payer: Self-pay

## 2020-08-11 ENCOUNTER — Encounter: Payer: Self-pay | Admitting: Nurse Practitioner

## 2020-08-11 VITALS — BP 97/58 | HR 97 | Wt 125.0 lb

## 2020-08-11 DIAGNOSIS — Z3480 Encounter for supervision of other normal pregnancy, unspecified trimester: Secondary | ICD-10-CM

## 2020-08-11 DIAGNOSIS — Z3A16 16 weeks gestation of pregnancy: Secondary | ICD-10-CM

## 2020-08-11 MED ORDER — PRENATAL 27-1 MG PO TABS: 1.0000 | ORAL_TABLET | Freq: Every day | ORAL | 11 refills | Status: DC

## 2020-08-11 NOTE — Progress Notes (Signed)
    Subjective:  Jade Stafford is a 25 y.o. G3P2002 at [redacted]w[redacted]d being seen today for ongoing prenatal care.  She is currently monitored for the following issues for this low-risk pregnancy and has Language barrier, cultural differences; Low back pain during pregnancy in first trimester; and Supervision of other normal pregnancy, antepartum on their problem list.  Patient reports no complaints.  Contractions: Not present. Vag. Bleeding: None.  Movement: Present. Denies leaking of fluid.   The following portions of the patient's history were reviewed and updated as appropriate: allergies, current medications, past family history, past medical history, past social history, past surgical history and problem list. Problem list updated.  Objective:   Vitals:   08/11/20 0923  BP: (!) 97/58  Pulse: 97  Weight: 125 lb (56.7 kg)    Fetal Status: Fetal Heart Rate (bpm): 140   Movement: Present     General:  Alert, oriented and cooperative. Patient is in no acute distress.  Skin: Skin is warm and dry. No rash noted.   Cardiovascular: Normal heart rate noted  Respiratory: Normal respiratory effort, no problems with respiration noted  Abdomen: Soft, gravid, appropriate for gestational age. Pain/Pressure: Present     Pelvic:  Cervical exam deferred        Extremities: Normal range of motion.  Edema: None  Mental Status: Normal mood and affect. Normal behavior. Normal judgment and thought content.   Urinalysis:      Assessment and Plan:  Pregnancy: G3P2002 at [redacted]w[redacted]d  1. Supervision of other normal pregnancy, antepartum Has had no appetite - reviewed eating small amounts of food frequently as her body needs nutrients for the growing baby.  Was not able to pick up PNV prescribed earlier - they were not at the pharmacy.  Will prescribe again today.  Will plan to pick up. Her previous low back pain has now resolved.  - AFP, Serum, Open Spina Bifida  2. [redacted] weeks gestation of pregnancy   Preterm  labor symptoms and general obstetric precautions including but not limited to vaginal bleeding, contractions, leaking of fluid and fetal movement were reviewed in detail with the patient. Please refer to After Visit Summary for other counseling recommendations.  Return in about 4 weeks (around 09/08/2020) for in person ROB.  Nolene Bernheim, RN, MSN, NP-BC Nurse Practitioner, [facility] for [employer], [facility] 08/11/2020 8:18 PM

## 2020-08-15 LAB — AFP, SERUM, OPEN SPINA BIFIDA
AFP MoM: 0.62
AFP Value: 24.6 ng/mL
Gest. Age on Collection Date: 16 weeks
Maternal Age At EDD: 26.5 yr
OSBR Risk 1 IN: 10000
Test Results:: NEGATIVE
Weight: 125 [lb_av]

## 2020-09-01 ENCOUNTER — Ambulatory Visit: Payer: Medicaid Other | Attending: Maternal & Fetal Medicine | Admitting: Maternal & Fetal Medicine

## 2020-09-01 ENCOUNTER — Other Ambulatory Visit: Payer: Self-pay

## 2020-09-01 ENCOUNTER — Ambulatory Visit: Payer: Medicaid Other | Attending: Student

## 2020-09-01 ENCOUNTER — Other Ambulatory Visit: Payer: Self-pay | Admitting: Student

## 2020-09-01 ENCOUNTER — Other Ambulatory Visit: Payer: Self-pay | Admitting: *Deleted

## 2020-09-01 DIAGNOSIS — O43199 Other malformation of placenta, unspecified trimester: Secondary | ICD-10-CM

## 2020-09-01 DIAGNOSIS — O43112 Circumvallate placenta, second trimester: Secondary | ICD-10-CM

## 2020-09-01 DIAGNOSIS — Z3480 Encounter for supervision of other normal pregnancy, unspecified trimester: Secondary | ICD-10-CM

## 2020-09-01 DIAGNOSIS — O43192 Other malformation of placenta, second trimester: Secondary | ICD-10-CM

## 2020-09-01 NOTE — Progress Notes (Signed)
MFM Brief Consultation  Ms. Jade Stafford is a 25 yo G3P2 who is here at 69 w 0d with a single intrauterine pregnancy here for a detailed anatomy due to marinal vs velamentous cord insertion.  Normal anatomy with measurements consistent with dates There is good fetal movement and amniotic fluid volume Suboptimal views of the fetal anatomy were obtained secondary to fetal position.  I discussed with [name] that the placenta appears to be inserting near the edge of the placenta vs the membranes. There are some images that are suggestive of a velamentous cord insertion, however, the most consistent imaging suggest a marginal cord insertion. I reviewed that there is a increased association for small for gestational age in pregnancies with a marginal cord insertion. Therefore we recommend serial growth exams throughout the pregnancy to monitor growth.  We will also confirm the diagnosis as well vs a velamentous cord insertion.   Recommendations: Follow up growth in 4 weeks  I spent 30 minutes with > 50% in face to face consultation.  Novella Olive, MD.

## 2020-09-08 ENCOUNTER — Other Ambulatory Visit: Payer: Self-pay

## 2020-09-08 ENCOUNTER — Ambulatory Visit (INDEPENDENT_AMBULATORY_CARE_PROVIDER_SITE_OTHER): Payer: Medicaid Other | Admitting: Family Medicine

## 2020-09-08 VITALS — BP 97/54 | HR 76 | Wt 131.0 lb

## 2020-09-08 DIAGNOSIS — Z3480 Encounter for supervision of other normal pregnancy, unspecified trimester: Secondary | ICD-10-CM

## 2020-09-08 DIAGNOSIS — Z23 Encounter for immunization: Secondary | ICD-10-CM

## 2020-09-08 DIAGNOSIS — O43199 Other malformation of placenta, unspecified trimester: Secondary | ICD-10-CM | POA: Insufficient documentation

## 2020-09-08 NOTE — Progress Notes (Signed)
Subjective:  Jade Stafford is a 25 y.o. G3P2002 at [redacted]w[redacted]d being seen today for ongoing prenatal care.  She is currently monitored for the following issues for this low-risk pregnancy and has Language barrier, cultural differences; Low back pain during pregnancy in first trimester; Supervision of other normal pregnancy, antepartum; and Marginal insertion of umbilical cord affecting management of mother on their problem list.  Patient reports no complaints.  Contractions: Not present. Vag. Bleeding: None.  Movement: Present. Denies leaking of fluid.   The following portions of the patient's history were reviewed and updated as appropriate: allergies, current medications, past family history, past medical history, past social history, past surgical history and problem list. Problem list updated.  Objective:   Vitals:   09/08/20 0925  BP: (!) 97/54  Pulse: 76  Weight: 131 lb (59.4 kg)    Fetal Status: Fetal Heart Rate (bpm): 136 Fundal Height: 19 cm Movement: Present     General:  Alert, oriented and cooperative. Patient is in no acute distress.  Skin: Skin is warm and dry. No rash noted.   Cardiovascular: Normal heart rate noted  Respiratory: Normal respiratory effort, no problems with respiration noted  Abdomen: Soft, gravid, appropriate for gestational age. Pain/Pressure: Absent     Pelvic: Vag. Bleeding: None     Cervical exam deferred        Extremities: Normal range of motion.  Edema: Trace  Mental Status: Normal mood and affect. Normal behavior. Normal judgment and thought content.    Assessment and Plan:  Pregnancy: G3P2002 at [redacted]w[redacted]d  1. Marginal insertion of umbilical cord affecting management of mother Per MFM will get serial growth [place]. Next [place] scheduled 10/13/2020.   2. Need for immunization against influenza - Flu Vaccine QUAD 69mo+IM (Fluarix, Fluzone & Alfiuria Quad PF)  3. Supervision of other normal pregnancy, antepartum Fundal height and fetal heart tones  appropriate. No acute obstetric concerns at this time. Planning to breastfeed. Still undecided on birth control. Plans [facility] for pediatrician.  - Follow up in 4 weeks  Preterm labor symptoms and general obstetric precautions including but not limited to vaginal bleeding, contractions, leaking of fluid and fetal movement were reviewed in detail with the patient. Please refer to After Visit Summary for other counseling recommendations.  Return in about 4 weeks (around 10/06/2020) for LROB, any provider.   Warner Mccreedy, MD, MPH OB Fellow, Faculty Practice

## 2020-10-01 ENCOUNTER — Ambulatory Visit: Payer: Medicaid Other | Admitting: Podiatry

## 2020-10-01 ENCOUNTER — Encounter: Payer: Self-pay | Admitting: Podiatry

## 2020-10-01 ENCOUNTER — Other Ambulatory Visit: Payer: Self-pay

## 2020-10-01 DIAGNOSIS — L600 Ingrowing nail: Secondary | ICD-10-CM

## 2020-10-01 NOTE — Patient Instructions (Addendum)

## 2020-10-02 NOTE — Progress Notes (Signed)
  Subjective:  Patient ID: Jade Stafford, female    DOB: 07-15-1995,  MRN: [id number]  Chief Complaint  Patient presents with   Ingrown Toenail     bilateral ingrown     25 y.o. female presents with the above complaint. History confirmed with patient.   Objective:  Physical Exam: warm, good capillary refill, no trophic changes or ulcerative lesions, normal DP and PT pulses, and normal sensory exam.  Bilateral hallux lateral border ingrown toenails without paronychia  Assessment:  No diagnosis found.   Plan:  Patient was evaluated and treated and all questions answered.    Ingrown Nail, bilaterally -Patient elects to proceed with minor surgery to remove ingrown toenail today. Consent reviewed and signed by patient. -Ingrown nail excised. See procedure note. -Educated on post-procedure care including soaking. Written instructions provided and reviewed. -She will follow-up in about 6 months for a permanent procedure when she is finished breast-feeding  Procedure: Excision of Ingrown Toenail Location: Bilateral 1st toe lateral nail borders. Anesthesia: Lidocaine 1% plain; 1.5 mL and Marcaine 0.5% plain; 1.5 mL, digital block. Skin Prep: Betadine. Dressing: Silvadene; telfa; dry, sterile, compression dressing. Technique: Following skin prep, the toe was exsanguinated and a tourniquet was secured at the base of the toe. The affected nail border was freed, split with a nail splitter, and excised. Chemical matrixectomy was then performed with phenol and irrigated out with alcohol. The tourniquet was then removed and sterile dressing applied. Disposition: Patient tolerated procedure well.     Return if symptoms worsen or fail to improve.

## 2020-10-07 ENCOUNTER — Other Ambulatory Visit: Payer: Self-pay

## 2020-10-07 ENCOUNTER — Ambulatory Visit (INDEPENDENT_AMBULATORY_CARE_PROVIDER_SITE_OTHER): Payer: Medicaid Other | Admitting: Obstetrics & Gynecology

## 2020-10-07 VITALS — BP 102/66 | HR 101 | Wt 137.2 lb

## 2020-10-07 DIAGNOSIS — O43199 Other malformation of placenta, unspecified trimester: Secondary | ICD-10-CM

## 2020-10-07 DIAGNOSIS — Z3A24 24 weeks gestation of pregnancy: Secondary | ICD-10-CM

## 2020-10-07 DIAGNOSIS — Z3480 Encounter for supervision of other normal pregnancy, unspecified trimester: Secondary | ICD-10-CM

## 2020-10-07 MED ORDER — PRENATAL 27-1 MG PO TABS
1.0000 | ORAL_TABLET | Freq: Every day | ORAL | 11 refills | Status: DC
Start: 2020-10-07 — End: 2020-12-08

## 2020-10-07 NOTE — Progress Notes (Signed)
   PRENATAL VISIT NOTE  Subjective:  Jade Stafford is a 25 y.o. G3P2002 at [redacted]w[redacted]d being seen today for ongoing prenatal care. Dari interpreter present.  She is currently monitored for the following issues for this low-risk pregnancy and has Language barrier, cultural differences; Supervision of other normal pregnancy, antepartum; and Marginal insertion of umbilical cord affecting management of mother on their problem list.  Patient reports no complaints.  Contractions: Not present. Vag. Bleeding: None.  Movement: Present. Denies leaking of fluid.   The following portions of the patient's history were reviewed and updated as appropriate: allergies, current medications, past family history, past medical history, past social history, past surgical history and problem list.   Objective:   Vitals:   10/07/20 1012  BP: 102/66  Pulse: (!) 101  Weight: 137 lb 3.2 oz (62.2 kg)    Fetal Status: Fetal Heart Rate (bpm): 140   Movement: Present     General:  Alert, oriented and cooperative. Patient is in no acute distress.  Skin: Skin is warm and dry. No rash noted.   Cardiovascular: Normal heart rate noted  Respiratory: Normal respiratory effort, no problems with respiration noted  Abdomen: Soft, gravid, appropriate for gestational age.  Pain/Pressure: Present     Pelvic: Cervical exam deferred        Extremities: Normal range of motion.  Edema: Trace  Mental Status: Normal mood and affect. Normal behavior. Normal judgment and thought content.   Assessment and Plan:  Pregnancy: G3P2002 at [redacted]w[redacted]d 1. Marginal insertion of umbilical cord affecting management of mother Scans as per MFM, next one in 10/13/20  2. [redacted] weeks gestation of pregnancy 3. Supervision of other normal pregnancy, antepartum Third trimester labs next visit. Prenatal vitamins refilled.  Preterm labor symptoms and general obstetric precautions including but not limited to vaginal bleeding, contractions, leaking of fluid and  fetal movement were reviewed in detail with the patient. Please refer to After Visit Summary for other counseling recommendations.   Return in about 4 weeks (around 11/04/2020) for 2 hr GTT, 3rd trimester labs, TDap, OFFICE OB VISIT (MD or APP).  Future Appointments  Date Time Provider Department Center  10/13/2020  9:30 AM [facility] NURSE [facility]  10/13/2020  9:45 AM [facility] US5 [facility]    Jaynie Collins, MD

## 2020-10-07 NOTE — Patient Instructions (Signed)
Return to office for any scheduled appointments. Call the office or go to the MAU at [facility] [place] if:  You begin to have strong, frequent contractions  Your water breaks.  Sometimes it is a big gush of fluid, sometimes it is just a trickle that keeps getting your panties wet or running down your legs  You have vaginal bleeding.  It is normal to have a small amount of spotting if your cervix was checked.   You do not feel your baby moving like normal.  If you do not, get something to eat and drink and lay down and focus on feeling your baby move.   If your baby is still not moving like normal, you should call the office or go to MAU.  Any other obstetric concerns.  TDaP Vaccine Pregnancy Get the Whooping Cough Vaccine While You Are Pregnant (CDC)  It is important for women to get the whooping cough vaccine in the third trimester of each pregnancy. Vaccines are the best way to prevent this disease. There are 2 different whooping cough vaccines. Both vaccines combine protection against whooping cough, tetanus and diphtheria, but they are for different age groups: Tdap: for everyone 11 years or older, including pregnant women  DTaP: for children 2 months through 6 years of age  You need the whooping cough vaccine during each of your pregnancies The recommended time to get the shot is during your 27th through 36th week of pregnancy, preferably during the earlier part of this time period. The Centers for Disease Control and Prevention (CDC) recommends that pregnant women receive the whooping cough vaccine for adolescents and adults (called Tdap vaccine) during the third trimester of each pregnancy. The recommended time to get the shot is during your 27th through 36th week of pregnancy, preferably during the earlier part of this time period. This replaces the original recommendation that pregnant women get the vaccine only if they had not previously received it. The  American College of Obstetricians and Gynecologists and the American College of Nurse-Midwives support this recommendation.  You should get the whooping cough vaccine while pregnant to pass protection to your baby frame support disabled and/or not supported in this browser  Learn why Laura decided to get the whooping cough vaccine in her 3rd trimester of pregnancy and how her baby girl was born with some protection against the disease. Also available on YouTube. After receiving the whooping cough vaccine, your body will create protective antibodies (proteins produced by the body to fight off diseases) and pass some of them to your baby before birth. These antibodies provide your baby some short-term protection against whooping cough in early life. These antibodies can also protect your baby from some of the more serious complications that come along with whooping cough. Your protective antibodies are at their highest about 2 weeks after getting the vaccine, but it takes time to pass them to your baby. So the preferred time to get the whooping cough vaccine is early in your third trimester. The amount of whooping cough antibodies in your body decreases over time. That is why CDC recommends you get a whooping cough vaccine during each pregnancy. Doing so allows each of your babies to get the greatest number of protective antibodies from you. This means each of your babies will get the best protection possible against this disease.  Getting the whooping cough vaccine while pregnant is better than getting the vaccine after you give birth Whooping cough vaccination during   pregnancy is ideal so your baby will have short-term protection as soon as he is born. This early protection is important because your baby will not start getting his whooping cough vaccines until he is 2 months old. These first few months of life are when your baby is at greatest risk for catching whooping cough. This is also when he's at  greatest risk for having severe, potentially life-threating complications from the infection. To avoid that gap in protection, it is best to get a whooping cough vaccine during pregnancy. You will then pass protection to your baby before he is born. To continue protecting your baby, he should get whooping cough vaccines starting at 2 months old. You may never have gotten the Tdap vaccine before and did not get it during this pregnancy. If so, you should make sure to get the vaccine immediately after you give birth, before leaving the hospital or birthing center. It will take about 2 weeks before your body develops protection (antibodies) in response to the vaccine. Once you have protection from the vaccine, you are less likely to give whooping cough to your newborn while caring for him. But remember, your baby will still be at risk for catching whooping cough from others. A recent study looked to see how effective Tdap was at preventing whooping cough in babies whose mothers got the vaccine while pregnant or in the hospital after giving birth. The study found that getting Tdap between 27 through 36 weeks of pregnancy is 85% more effective at preventing whooping cough in babies younger than 2 months old. Blood tests cannot tell if you need a whooping cough vaccine There are no blood tests that can tell you if you have enough antibodies in your body to protect yourself or your baby against whooping cough. Even if you have been sick with whooping cough in the past or previously received the vaccine, you still should get the vaccine during each pregnancy. Breastfeeding may pass some protective antibodies onto your baby By breastfeeding, you may pass some antibodies you have made in response to the vaccine to your baby. When you get a whooping cough vaccine during your pregnancy, you will have antibodies in your breast milk that you can share with your baby as soon as your milk comes in. However, your baby will not  get protective antibodies immediately if you wait to get the whooping cough vaccine until after delivering your baby. This is because it takes about 2 weeks for your body to create antibodies. Learn more about the health benefits of breastfeeding.  

## 2020-10-13 ENCOUNTER — Ambulatory Visit: Payer: Medicaid Other | Attending: Maternal & Fetal Medicine

## 2020-10-13 ENCOUNTER — Other Ambulatory Visit: Payer: Self-pay | Admitting: *Deleted

## 2020-10-13 ENCOUNTER — Other Ambulatory Visit: Payer: Self-pay

## 2020-10-13 ENCOUNTER — Ambulatory Visit: Payer: Medicaid Other | Admitting: *Deleted

## 2020-10-13 ENCOUNTER — Encounter: Payer: Self-pay | Admitting: *Deleted

## 2020-10-13 VITALS — BP 106/60 | HR 83

## 2020-10-13 DIAGNOSIS — Z3480 Encounter for supervision of other normal pregnancy, unspecified trimester: Secondary | ICD-10-CM | POA: Insufficient documentation

## 2020-10-13 DIAGNOSIS — O43199 Other malformation of placenta, unspecified trimester: Secondary | ICD-10-CM

## 2020-10-13 DIAGNOSIS — O43112 Circumvallate placenta, second trimester: Secondary | ICD-10-CM

## 2020-10-13 DIAGNOSIS — O43193 Other malformation of placenta, third trimester: Secondary | ICD-10-CM | POA: Diagnosis not present

## 2020-10-13 DIAGNOSIS — Z3A25 25 weeks gestation of pregnancy: Secondary | ICD-10-CM | POA: Diagnosis not present

## 2020-10-28 ENCOUNTER — Encounter: Payer: Self-pay | Admitting: *Deleted

## 2020-11-04 ENCOUNTER — Encounter: Payer: Medicaid Other | Admitting: Obstetrics and Gynecology

## 2020-11-05 ENCOUNTER — Other Ambulatory Visit: Payer: Medicaid Other

## 2020-11-05 ENCOUNTER — Other Ambulatory Visit: Payer: Self-pay | Admitting: *Deleted

## 2020-11-05 ENCOUNTER — Other Ambulatory Visit: Payer: Self-pay

## 2020-11-05 ENCOUNTER — Ambulatory Visit (INDEPENDENT_AMBULATORY_CARE_PROVIDER_SITE_OTHER): Payer: Medicaid Other

## 2020-11-05 VITALS — BP 117/66 | HR 76 | Wt 145.6 lb

## 2020-11-05 DIAGNOSIS — Z3480 Encounter for supervision of other normal pregnancy, unspecified trimester: Secondary | ICD-10-CM

## 2020-11-05 DIAGNOSIS — Z23 Encounter for immunization: Secondary | ICD-10-CM | POA: Diagnosis not present

## 2020-11-05 DIAGNOSIS — Z3A28 28 weeks gestation of pregnancy: Secondary | ICD-10-CM

## 2020-11-05 DIAGNOSIS — Z5941 Food insecurity: Secondary | ICD-10-CM

## 2020-11-05 DIAGNOSIS — O43199 Other malformation of placenta, unspecified trimester: Secondary | ICD-10-CM

## 2020-11-05 NOTE — Progress Notes (Signed)
   PRENATAL VISIT NOTE  Subjective:  Jade Stafford is a 25 y.o. G3P2002 at [redacted]w[redacted]d being seen today for ongoing prenatal care.  She is currently monitored for the following issues for this low-risk pregnancy and has Language barrier, cultural differences; Supervision of other normal pregnancy, antepartum; and Marginal insertion of umbilical cord affecting management of mother on their problem list.  Patient reports no complaints.  Contractions: Not present. Vag. Bleeding: None.  Movement: Present. Denies leaking of fluid.   The following portions of the patient's history were reviewed and updated as appropriate: allergies, current medications, past family history, past medical history, past social history, past surgical history and problem list.   Objective:   Vitals:   11/05/20 0837  BP: 117/66  Pulse: 76  Weight: 145 lb 9.6 oz (66 kg)    Fetal Status: Fetal Heart Rate (bpm): 143 Fundal Height: 26 cm Movement: Present     General:  Alert, oriented and cooperative. Patient is in no acute distress.  Skin: Skin is warm and dry. No rash noted.   Cardiovascular: Normal heart rate noted  Respiratory: Normal respiratory effort, no problems with respiration noted  Abdomen: Soft, gravid, appropriate for gestational age.  Pain/Pressure: Present     Pelvic: Cervical exam deferred        Extremities: Normal range of motion.  Edema: Trace  Mental Status: Normal mood and affect. Normal behavior. Normal judgment and thought content.   Assessment and Plan:  Pregnancy: G3P2002 at [redacted]w[redacted]d 1. Food insecurity  - AMBULATORY REFERRAL TO [facility]  2. Supervision of other normal pregnancy, antepartum - Routine OB. GTT and 28 weeks labs today - No concerns - Discussed contraception options today and patient is wanting IUD - Interpretor used for entirety of visit  3. [redacted] weeks gestation of pregnancy   4. Marginal insertion of umbilical cord affecting management of mother - FH 26cm  today, last growth on 10/11 32% - Growth [place] on 11/8   Preterm labor symptoms and general obstetric precautions including but not limited to vaginal bleeding, contractions, leaking of fluid and fetal movement were reviewed in detail with the patient. Please refer to After Visit Summary for other counseling recommendations.   Return in about 2 weeks (around 11/19/2020).  Future Appointments  Date Time Provider Department Center  11/10/2020  2:45 PM [facility] NURSE [facility]  11/10/2020  3:00 PM [facility] US1 [facility]  11/20/2020  8:35 AM Anyanwu, Jethro Bastos, MD [facility]    Brand Males, CNM 11/05/20 10:50 AM

## 2020-11-06 LAB — CBC
Hematocrit: 34.8 % (ref 34.0–46.6)
Hemoglobin: 11.7 g/dL (ref 11.1–15.9)
MCH: 29.5 pg (ref 26.6–33.0)
MCHC: 33.6 g/dL (ref 31.5–35.7)
MCV: 88 fL (ref 79–97)
Platelets: 210 10*3/uL (ref 150–450)
RBC: 3.97 x10E6/uL (ref 3.77–5.28)
RDW: 11.7 % (ref 11.7–15.4)
WBC: 7.5 10*3/uL (ref 3.4–10.8)

## 2020-11-06 LAB — GLUCOSE TOLERANCE, 2 HOURS W/ 1HR
Glucose, 1 hour: 112 mg/dL (ref 70–179)
Glucose, 2 hour: 82 mg/dL (ref 70–152)
Glucose, Fasting: 74 mg/dL (ref 70–91)

## 2020-11-06 LAB — HIV ANTIBODY (ROUTINE TESTING W REFLEX): HIV Screen 4th Generation wRfx: NONREACTIVE

## 2020-11-06 LAB — RPR: RPR Ser Ql: NONREACTIVE

## 2020-11-10 ENCOUNTER — Ambulatory Visit: Payer: Medicaid Other

## 2020-11-10 ENCOUNTER — Other Ambulatory Visit: Payer: Self-pay | Admitting: *Deleted

## 2020-11-10 ENCOUNTER — Ambulatory Visit: Payer: Medicaid Other | Admitting: *Deleted

## 2020-11-10 ENCOUNTER — Encounter: Payer: Self-pay | Admitting: *Deleted

## 2020-11-10 ENCOUNTER — Other Ambulatory Visit: Payer: Self-pay

## 2020-11-10 ENCOUNTER — Ambulatory Visit: Payer: Medicaid Other | Attending: Obstetrics and Gynecology

## 2020-11-10 VITALS — BP 108/53 | HR 72

## 2020-11-10 DIAGNOSIS — Z3480 Encounter for supervision of other normal pregnancy, unspecified trimester: Secondary | ICD-10-CM

## 2020-11-10 DIAGNOSIS — O43199 Other malformation of placenta, unspecified trimester: Secondary | ICD-10-CM | POA: Diagnosis present

## 2020-11-10 DIAGNOSIS — O43193 Other malformation of placenta, third trimester: Secondary | ICD-10-CM

## 2020-11-10 DIAGNOSIS — Z3A29 29 weeks gestation of pregnancy: Secondary | ICD-10-CM | POA: Diagnosis not present

## 2020-11-11 ENCOUNTER — Telehealth: Payer: Self-pay

## 2020-11-11 NOTE — Telephone Encounter (Signed)
[facility] [phone] to advise patient of upcoming appt information 12/6 & 12/27.

## 2020-11-20 ENCOUNTER — Encounter: Payer: Self-pay | Admitting: Obstetrics & Gynecology

## 2020-11-20 ENCOUNTER — Ambulatory Visit (INDEPENDENT_AMBULATORY_CARE_PROVIDER_SITE_OTHER): Payer: Medicaid Other | Admitting: Obstetrics & Gynecology

## 2020-11-20 ENCOUNTER — Other Ambulatory Visit: Payer: Self-pay

## 2020-11-20 VITALS — BP 99/69 | HR 93 | Wt 148.2 lb

## 2020-11-20 DIAGNOSIS — O43199 Other malformation of placenta, unspecified trimester: Secondary | ICD-10-CM

## 2020-11-20 DIAGNOSIS — Z3480 Encounter for supervision of other normal pregnancy, unspecified trimester: Secondary | ICD-10-CM

## 2020-11-20 DIAGNOSIS — Z3A30 30 weeks gestation of pregnancy: Secondary | ICD-10-CM

## 2020-11-20 DIAGNOSIS — Z5941 Food insecurity: Secondary | ICD-10-CM

## 2020-11-20 NOTE — Progress Notes (Signed)
PRENATAL VISIT NOTE  Subjective:  Allyana Stafford is a 25 y.o. G3P2002 at [redacted]w[redacted]d being seen today for ongoing prenatal care.  Patient is Dari-speaking only, interpreter present for this encounter. She is currently monitored for the following issues for this low-risk pregnancy and has Language barrier, cultural differences; Supervision of other normal pregnancy, antepartum; and Marginal insertion of umbilical cord affecting management of mother on their problem list.  Patient reports no complaints.  Contractions: Not present. Vag. Bleeding: None.  Movement: Present. Denies leaking of fluid.   The following portions of the patient's history were reviewed and updated as appropriate: allergies, current medications, past family history, past medical history, past social history, past surgical history and problem list.   Objective:   Vitals:   11/20/20 0849  BP: 99/69  Pulse: 93  Weight: 148 lb 3.2 oz (67.2 kg)    Fetal Status: Fetal Heart Rate (bpm): 138   Movement: Present     General:  Alert, oriented and cooperative. Patient is in no acute distress.  Skin: Skin is warm and dry. No rash noted.   Cardiovascular: Normal heart rate noted  Respiratory: Normal respiratory effort, no problems with respiration noted  Abdomen: Soft, gravid, appropriate for gestational age.  Pain/Pressure: Absent     Pelvic: Cervical exam deferred        Extremities: Normal range of motion.  Edema: None  Mental Status: Normal mood and affect. Normal behavior. Normal judgment and thought content.   Imaging: [place] MFM OB FOLLOW UP  Result Date: 11/11/2020 ----------------------------------------------------------------------  OBSTETRICS REPORT                       (Signed Final 11/11/2020 09:25 am) ---------------------------------------------------------------------- Patient Info  ID #:       [id number]                          D.O.B.:  Nov 11, 1995 (25 yrs)  Name:       Jade Stafford [facility]                  Visit  Date: 11/10/2020 03:05 pm              [facility] ---------------------------------------------------------------------- Performed By  Attending:        Lin Landsman      Ref. Address:     [address]                    [place]                                                             [place]                                                             [place] [address]  Performed By:     Fayne Norrie BS,      [facility]  Referred By:      Jorje Guild CNM ---------------------------------------------------------------------- Orders  #  Description                           Code        Ordered By  1  [place] MFM OB FOLLOW UP                   [id number]    Noralee Space ----------------------------------------------------------------------  #  Order #                     Accession #                Episode #  1  [id number]                   [id number]                 [id number] ---------------------------------------------------------------------- Indications  Marginal insertion of umbilical cord affecting O43.192  management of mother in second trimester  [redacted] weeks gestation of pregnancy                Z3A.29 ---------------------------------------------------------------------- Fetal Evaluation  Num Of Fetuses:         1  Fetal Heart Rate(bpm):  132  Cardiac Activity:       Observed  Presentation:           Cephalic  Placenta:               Posterior  P. Cord Insertion:      Marginal insertion  Amniotic Fluid  AFI FV:      Within normal limits  AFI Sum(cm)     %Tile       Largest Pocket(cm)  13.18           39          5.5  RUQ(cm)       RLQ(cm)       LUQ(cm)        LLQ(cm)  2.02          3.37          2.29           5.5  ---------------------------------------------------------------------- Biometry  BPD:        74  mm     G. Age:  29w 5d  60  %    CI:        77.55   %    70 - 86                                                          FL/HC:      19.4   %    19.6 - 20.8  HC:       266   mm     G. Age:  29w 0d         17  %    HC/AC:      1.03        0.99 - 1.21  AC:      257.3  mm     G. Age:  29w 6d         71  %    FL/BPD:     69.6   %    71 - 87  FL:       51.5  mm     G. Age:  27w 4d          6  %    FL/AC:      20.0   %    20 - 24  LV:        7.2  mm  Est. FW:    1323  gm    2 lb 15 oz      38  % ---------------------------------------------------------------------- OB History  Blood Type:   A+  Gravidity:    3         Term:   2 ---------------------------------------------------------------------- Gestational Age  LMP:           28w 3d        Date:  04/25/20                 EDD:   01/30/21  U/S Today:     29w 0d                                        EDD:   01/26/21  Best:          29w 0d     Det. By:  Marcella Dubs         EDD:   01/26/21                                      (06/17/20) ---------------------------------------------------------------------- Anatomy  Cranium:               Appears normal         LVOT:                   Appears normal  Cavum:                 Appears normal         Aortic Arch:            Appears normal  Ventricles:            Appears normal         Ductal Arch:  Appears normal  Choroid Plexus:        Previously seen        Diaphragm:              Appears normal  Cerebellum:            Previously seen        Stomach:                Appears normal, left                                                                        sided  Posterior Fossa:       Previously seen        Abdomen:                Appears normal  Nuchal Fold:           Not applicable (>20    Abdominal Wall:         Previously seen                         wks GA)  Face:                  Orbits and profile      Cord Vessels:           Appears normal (3                         previously seen                                vessel cord)  Lips:                  Previously seen        Kidneys:                Appear normal  Palate:                Previously seen        Bladder:                Appears normal  Thoracic:              Appears normal         Spine:                  Previously seen                         Appears normal  Heart:                 Appears normal         Upper Extremities:      Previously seen                         (4CH, axis, and                         situs)  RVOT:  Previously seen        Lower Extremities:      Previously seen  Other:  Fetus appears to be a female. VC, 3VV and 3VTV prev visualized. ---------------------------------------------------------------------- Cervix Uterus Adnexa  Cervix  Not visualized (advanced GA >24wks) ---------------------------------------------------------------------- Impression  Follow up growth due to marginal cord insertion  Normal interval growth with measurements consistent with  dates  Good fetal movement and amniotic fluid volume  Marginal cord insertion persist today. ---------------------------------------------------------------------- Recommendations  Follow up growth in 4 weeks and in 8 weeks. ----------------------------------------------------------------------               Lin Landsman, MD Electronically Signed Final Report   11/11/2020 09:25 am ----------------------------------------------------------------------   Assessment and Plan:  Pregnancy: M5H8469 at [redacted]w[redacted]d 1. Marginal insertion of umbilical cord affecting management of mother EFW 38% on 11/11/2031, continue growth scans as per MFM.  2. Food insecurity Taken to food market - AMBULATORY REFERRAL TO [facility]  3. [redacted] weeks gestation of pregnancy 4. Supervision of other normal pregnancy, antepartum Normal third trimester labs reviewed. Preterm labor  symptoms and general obstetric precautions including but not limited to vaginal bleeding, contractions, leaking of fluid and fetal movement were reviewed in detail with the patient. Please refer to After Visit Summary for other counseling recommendations.   Return in about 2 weeks (around 12/04/2020) for OFFICE OB VISIT (MD or APP).  Future Appointments  Date Time Provider Department Center  12/08/2020  9:30 AM [facility] NURSE [facility]  12/08/2020  9:45 AM [facility] US6 [facility]  12/29/2020  9:30 AM [facility]URSE [facility]  12/29/2020  9:45 AM [facility] US4 [facility]    Jaynie Collins, MD

## 2020-11-20 NOTE — Patient Instructions (Signed)
Return to office for any scheduled appointments. Call the office or go to the MAU at [facility] [place] if:  You begin to have strong, frequent contractions  Your water breaks.  Sometimes it is a big gush of fluid, sometimes it is just a trickle that keeps getting your panties wet or running down your legs  You have vaginal bleeding.  It is normal to have a small amount of spotting if your cervix was checked.   You do not feel your baby moving like normal.  If you do not, get something to eat and drink and lay down and focus on feeling your baby move.   If your baby is still not moving like normal, you should call the office or go to MAU.  Any other obstetric concerns.   

## 2020-12-08 ENCOUNTER — Ambulatory Visit: Payer: Medicaid Other | Attending: Maternal & Fetal Medicine

## 2020-12-08 ENCOUNTER — Ambulatory Visit (INDEPENDENT_AMBULATORY_CARE_PROVIDER_SITE_OTHER): Payer: Medicaid Other | Admitting: Advanced Practice Midwife

## 2020-12-08 ENCOUNTER — Other Ambulatory Visit: Payer: Self-pay

## 2020-12-08 ENCOUNTER — Ambulatory Visit: Payer: Medicaid Other | Admitting: *Deleted

## 2020-12-08 ENCOUNTER — Encounter: Payer: Self-pay | Admitting: *Deleted

## 2020-12-08 ENCOUNTER — Encounter: Payer: Self-pay | Admitting: Advanced Practice Midwife

## 2020-12-08 VITALS — BP 115/57 | HR 85 | Wt 153.0 lb

## 2020-12-08 VITALS — BP 106/56 | HR 73

## 2020-12-08 DIAGNOSIS — O43193 Other malformation of placenta, third trimester: Secondary | ICD-10-CM

## 2020-12-08 DIAGNOSIS — Z3A33 33 weeks gestation of pregnancy: Secondary | ICD-10-CM

## 2020-12-08 DIAGNOSIS — Z362 Encounter for other antenatal screening follow-up: Secondary | ICD-10-CM | POA: Insufficient documentation

## 2020-12-08 DIAGNOSIS — Z3480 Encounter for supervision of other normal pregnancy, unspecified trimester: Secondary | ICD-10-CM | POA: Insufficient documentation

## 2020-12-08 MED ORDER — PRENATAL 27-1 MG PO TABS: 1.0000 | ORAL_TABLET | Freq: Every day | ORAL | 11 refills | Status: AC

## 2020-12-08 NOTE — Progress Notes (Signed)
   PRENATAL VISIT NOTE  Subjective:  Jade Stafford is a 25 y.o. G3P2002 at [redacted]w[redacted]d being seen today for ongoing prenatal care.  She is currently monitored for the following issues for this low-risk pregnancy and has Language barrier, cultural differences; Supervision of other normal pregnancy, antepartum; and Marginal insertion of umbilical cord affecting management of mother on their problem list.  Patient reports no complaints.  Contractions: Irritability. Vag. Bleeding: None.  Movement: Present. Denies leaking of fluid.   The following portions of the patient's history were reviewed and updated as appropriate: allergies, current medications, past family history, past medical history, past social history, past surgical history and problem list.   Objective:   Vitals:   12/08/20 1326  BP: (!) 115/57  Pulse: 85  Weight: 69.4 kg    Fetal Status: Fetal Heart Rate (bpm): 135 Fundal Height: 32 cm Movement: Present     General:  Alert, oriented and cooperative. Patient is in no acute distress.  Skin: Skin is warm and dry. No rash noted.   Cardiovascular: Normal heart rate noted  Respiratory: Normal respiratory effort, no problems with respiration noted  Abdomen: Soft, gravid, appropriate for gestational age.  Pain/Pressure: Present     Pelvic: Cervical exam deferred        Extremities: Normal range of motion.  Edema: Trace  Mental Status: Normal mood and affect. Normal behavior. Normal judgment and thought content.   Assessment and Plan:  Pregnancy: G3P2002 at [redacted]w[redacted]d 1. Supervision of other normal pregnancy, antepartum - routine care  2. [redacted] weeks gestation of pregnancy   Preterm labor symptoms and general obstetric precautions including but not limited to vaginal bleeding, contractions, leaking of fluid and fetal movement were reviewed in detail with the patient. Please refer to After Visit Summary for other counseling recommendations.   Return in about 2 weeks (around  12/22/2020).  Future Appointments  Date Time Provider Department Center  12/29/2020  9:30 AM [facility] NURSE [facility]  12/29/2020  9:45 AM [facility] US4 [facility]   Thressa Sheller DNP, CNM  12/08/20  1:45 PM

## 2020-12-23 ENCOUNTER — Encounter: Payer: Self-pay | Admitting: Obstetrics & Gynecology

## 2020-12-23 ENCOUNTER — Ambulatory Visit (INDEPENDENT_AMBULATORY_CARE_PROVIDER_SITE_OTHER): Payer: Medicaid Other | Admitting: Obstetrics & Gynecology

## 2020-12-23 ENCOUNTER — Other Ambulatory Visit: Payer: Self-pay

## 2020-12-23 VITALS — BP 102/67 | HR 94 | Wt 155.0 lb

## 2020-12-23 DIAGNOSIS — O43193 Other malformation of placenta, third trimester: Secondary | ICD-10-CM

## 2020-12-23 DIAGNOSIS — O26843 Uterine size-date discrepancy, third trimester: Secondary | ICD-10-CM

## 2020-12-23 DIAGNOSIS — Z603 Acculturation difficulty: Secondary | ICD-10-CM

## 2020-12-23 DIAGNOSIS — Z3A35 35 weeks gestation of pregnancy: Secondary | ICD-10-CM

## 2020-12-24 NOTE — Progress Notes (Signed)
° °  PRENATAL VISIT NOTE  Subjective:  Jade Stafford is a 25 y.o. G3P2002 at [redacted]w[redacted]d being seen today for ongoing prenatal care.  She is currently monitored for the following issues for this low-risk pregnancy and has Language barrier, cultural differences; Supervision of other normal pregnancy, antepartum; and Marginal insertion of umbilical cord affecting management of mother on their problem list.  Patient reports no complaints.  Contractions: Irritability. Vag. Bleeding: None.  Movement: Present. Denies leaking of fluid.   The following portions of the patient's history were reviewed and updated as appropriate: allergies, current medications, past family history, past medical history, past social history, past surgical history and problem list.   Objective:   Vitals:   12/23/20 1111  BP: 102/67  Pulse: 94  Weight: 155 lb (70.3 kg)    Fetal Status: Fetal Heart Rate (bpm): 141 Fundal Height: 33 cm Movement: Present     General:  Alert, oriented and cooperative. Patient is in no acute distress.  Skin: Skin is warm and dry. No rash noted.   Cardiovascular: Normal heart rate noted  Respiratory: Normal respiratory effort, no problems with respiration noted  Abdomen: Soft, gravid, appropriate for gestational age.  Pain/Pressure: Present     Pelvic: Cervical exam deferred        Extremities: Normal range of motion.  Edema: Trace  Mental Status: Normal mood and affect. Normal behavior. Normal judgment and thought content.   Assessment and Plan:  Pregnancy: G3P2002 at [redacted]w[redacted]d 1. [redacted] weeks gestation of pregnancy - on PNV - return 1 week for GBS/GC/Chl testing  2. Marginal insertion of umbilical cord affecting management of mother in third trimester - u/s scheduled next week  3. Language barrier, cultural differences - translator used througout visit  4. Uterine size date discrepancy pregnancy, third trimester - uterus measuring behind dates today.  Has u/s scheduled for  12/29/2020 so encouraged pt to keep appt  Preterm labor symptoms and general obstetric precautions including but not limited to vaginal bleeding, contractions, leaking of fluid and fetal movement were reviewed in detail with the patient. Please refer to After Visit Summary for other counseling recommendations.   Return in about 1 week (around 12/30/2020) for Office ob visit (MD or APP), GC/Chl/GBS.  Future Appointments  Date Time Provider Department Center  12/29/2020  9:30 AM [facility] NURSE [facility]  12/29/2020  9:45 AM [facility] US[facility]  01/06/2021  3:35 PM Bernerd Limbo, CNM [facility]    Jerene Bears, MD

## 2020-12-29 ENCOUNTER — Ambulatory Visit: Payer: Medicaid Other | Attending: Obstetrics and Gynecology

## 2020-12-29 ENCOUNTER — Other Ambulatory Visit: Payer: Self-pay

## 2020-12-29 ENCOUNTER — Ambulatory Visit: Payer: Medicaid Other | Admitting: *Deleted

## 2020-12-29 VITALS — BP 116/66 | HR 87

## 2020-12-29 DIAGNOSIS — Z362 Encounter for other antenatal screening follow-up: Secondary | ICD-10-CM | POA: Diagnosis not present

## 2020-12-29 DIAGNOSIS — Z0372 Encounter for suspected placental problem ruled out: Secondary | ICD-10-CM | POA: Diagnosis not present

## 2020-12-29 DIAGNOSIS — O43193 Other malformation of placenta, third trimester: Secondary | ICD-10-CM | POA: Insufficient documentation

## 2020-12-29 DIAGNOSIS — Z3480 Encounter for supervision of other normal pregnancy, unspecified trimester: Secondary | ICD-10-CM

## 2020-12-29 DIAGNOSIS — Z3A36 36 weeks gestation of pregnancy: Secondary | ICD-10-CM | POA: Diagnosis not present

## 2021-01-03 NOTE — L&D Delivery Note (Addendum)
OB/GYN Faculty Practice Delivery Note Jade Stafford Jade Stafford is a 26 y.o. G1W2993 s/p SVD at [redacted]w[redacted]d. She was admitted for SROM.   ROM: 3h 17m with meconium stained fluid GBS Status: negative Maximum Maternal Temperature: 98  Labor Progress: [name] presented for SROM.  She progressed to complete w/o augmentation.   Delivery Date/Time: 01/22/2021 @ 1813 Delivery: Called to room and patient was complete and pushing. Head delivered OA over intact perinuem. Loose, body cord present. Shoulder and body delivered in usual fashion. Infant with spontaneous cry, placed on mother's abdomen, dried and stimulated. Cord clamped x 2 after 1-3 minute delay, and cut by husband. Cord blood drawn. Placenta delivered spontaneously with gentle cord traction. Fundus firm with massage and Pitocin. Labia, perineum, vagina, and cervix inspected inspected with first degree perineum.   Placenta: Marginal cord insertion, intact, 3 VC, and to L&D Complications: none Lacerations: first degree: hemostasis achieved with pressure, will continue to monitor. EBL: 100 ml Analgesia: Epidural   Postpartum Planning Admit to postpartum with routine orders  Infant: NBM   APGARs 9   9, weight pending  Interpreter used during delivery. Pt and husband verbalized understanding, no question or concerns during delivery or now.   Kandis Fantasia, Student-MidWife    Surveyor, quantity of midwife student: Evaluation and management procedures were performed by the midwife student under my supervision. I was immediately gloved, present, and available for direct supervision, assistance and direction throughout this encounter.  I also confirm that I have verified the information documented in the residents note, and that I have also personally reperformed the pertinent components of the physical exam and all of the medical decision making activities.  I have also made any necessary editorial changes.  Brand Males,  CNM 01/22/2021 7:06 PM

## 2021-01-06 ENCOUNTER — Other Ambulatory Visit (HOSPITAL_COMMUNITY)
Admission: RE | Admit: 2021-01-06 | Discharge: 2021-01-06 | Disposition: A | Discharge status: Home / Self Care | Payer: Medicaid Other | Source: Ambulatory Visit | Attending: Certified Nurse Midwife | Admitting: Certified Nurse Midwife

## 2021-01-06 ENCOUNTER — Ambulatory Visit (INDEPENDENT_AMBULATORY_CARE_PROVIDER_SITE_OTHER): Payer: Medicaid Other | Admitting: Certified Nurse Midwife

## 2021-01-06 ENCOUNTER — Other Ambulatory Visit: Payer: Self-pay

## 2021-01-06 VITALS — BP 113/78 | HR 92 | Wt 161.1 lb

## 2021-01-06 DIAGNOSIS — Z3493 Encounter for supervision of normal pregnancy, unspecified, third trimester: Secondary | ICD-10-CM | POA: Diagnosis not present

## 2021-01-06 DIAGNOSIS — Z5941 Food insecurity: Secondary | ICD-10-CM

## 2021-01-06 DIAGNOSIS — Z5982 Transportation insecurity: Secondary | ICD-10-CM

## 2021-01-06 DIAGNOSIS — Z3A37 37 weeks gestation of pregnancy: Secondary | ICD-10-CM

## 2021-01-06 LAB — OB RESULTS CONSOLE GC/CHLAMYDIA: Gonorrhea: NEGATIVE

## 2021-01-06 NOTE — Progress Notes (Signed)
° °  PRENATAL VISIT NOTE  Subjective:  Jade Stafford is a 26 y.o. G3P2002 at [redacted]w[redacted]d being seen today for ongoing prenatal care.  She is currently monitored for the following issues for this low-risk pregnancy and has Language barrier, cultural differences; Supervision of other normal pregnancy, antepartum; and Marginal insertion of umbilical cord affecting management of mother on their problem list.  Patient reports no complaints.  Contractions: Not present. Vag. Bleeding: None.  Movement: Present. Denies leaking of fluid.   The following portions of the patient's history were reviewed and updated as appropriate: allergies, current medications, past family history, past medical history, past social history, past surgical history and problem list.   Objective:   Vitals:   01/06/21 1519  BP: 113/78  Pulse: 92  Weight: 161 lb 1.6 oz (73.1 kg)    Fetal Status: Fetal Heart Rate (bpm): 138 Fundal Height: 34 cm Movement: Present  Presentation: Vertex  General:  Alert, oriented and cooperative. Patient is in no acute distress.  Skin: Skin is warm and dry. No rash noted.   Cardiovascular: Normal heart rate noted  Respiratory: Normal respiratory effort, no problems with respiration noted  Abdomen: Soft, gravid, appropriate for gestational age.  Pain/Pressure: Present     Pelvic: Cervical exam deferred        Extremities: Normal range of motion.  Edema: Trace  Mental Status: Normal mood and affect. Normal behavior. Normal judgment and thought content.   Assessment and Plan:  Pregnancy: G3P2002 at [redacted]w[redacted]d 1. Supervision of low-risk pregnancy, third trimester - Doing well, feeling regular and vigorous fetal movement   2. [redacted] weeks gestation of pregnancy - Routine OB care, measures 34wks but has been cleared by MFM for appropriate growth (last scan 12/29/20) and says all of her babies have been on the smaller side. - GBS and GC/CT collected  3. Transportation insecurity - Will have [facility]  transportation arranged for future appointments  4. Food insecurity - Visited Architect  Term labor symptoms and general obstetric precautions including but not limited to vaginal bleeding, contractions, leaking of fluid and fetal movement were reviewed in detail with the patient. Please refer to After Visit Summary for other counseling recommendations.   Return in about 1 week (around 01/13/2021) for IN-PERSON, LOB.  Future Appointments  Date Time Provider Department Center  01/14/2021  1:55 PM Reva Bores, MD [facility]    Bernerd Limbo, CNM

## 2021-01-07 LAB — GC/CHLAMYDIA PROBE AMP (~~LOC~~) NOT AT ARMC
Chlamydia: NEGATIVE
Comment: NEGATIVE
Comment: NORMAL
Neisseria Gonorrhea: NEGATIVE

## 2021-01-09 LAB — CULTURE, BETA STREP (GROUP B ONLY): Strep Gp B Culture: NEGATIVE

## 2021-01-14 ENCOUNTER — Ambulatory Visit (INDEPENDENT_AMBULATORY_CARE_PROVIDER_SITE_OTHER): Payer: Medicaid Other | Admitting: Family Medicine

## 2021-01-14 ENCOUNTER — Other Ambulatory Visit: Payer: Self-pay

## 2021-01-14 VITALS — BP 107/71 | HR 89 | Wt 163.1 lb

## 2021-01-14 DIAGNOSIS — Z603 Acculturation difficulty: Secondary | ICD-10-CM

## 2021-01-14 DIAGNOSIS — O43199 Other malformation of placenta, unspecified trimester: Secondary | ICD-10-CM

## 2021-01-14 DIAGNOSIS — Z3480 Encounter for supervision of other normal pregnancy, unspecified trimester: Secondary | ICD-10-CM

## 2021-01-14 NOTE — Progress Notes (Signed)
° °  PRENATAL VISIT NOTE  Subjective:  Jade Stafford is a 26 y.o. G3P2002 at [redacted]w[redacted]d being seen today for ongoing prenatal care.  She is currently monitored for the following issues for this low-risk pregnancy and has Language barrier, cultural differences; Supervision of other normal pregnancy, antepartum; and Marginal insertion of umbilical cord affecting management of mother on their problem list.  Patient reports no complaints.  Contractions: Irritability. Vag. Bleeding: None.  Movement: Present. Denies leaking of fluid.   The following portions of the patient's history were reviewed and updated as appropriate: allergies, current medications, past family history, past medical history, past social history, past surgical history and problem list.   Objective:   Vitals:   01/14/21 1406  BP: 107/71  Pulse: 89  Weight: 163 lb 1.6 oz (74 kg)    Fetal Status: Fetal Heart Rate (bpm): 145 Fundal Height: 33 cm Movement: Present  Presentation: Vertex  General:  Alert, oriented and cooperative. Patient is in no acute distress.  Skin: Skin is warm and dry. No rash noted.   Cardiovascular: Normal heart rate noted  Respiratory: Normal respiratory effort, no problems with respiration noted  Abdomen: Soft, gravid, appropriate for gestational age.  Pain/Pressure: Present     Pelvic: Cervical exam deferred        Extremities: Normal range of motion.     Mental Status: Normal mood and affect. Normal behavior. Normal judgment and thought content.   Assessment and Plan:  Pregnancy: G3P2002 at [redacted]w[redacted]d 1. Language barrier, cultural differences Dari interpreter: Mona used   2. Supervision of other normal pregnancy, antepartum Continue routine prenatal care. Declines IOL   3. Marginal insertion of umbilical cord affecting management of mother Last growth on 12/27 16%, vtx, nml fluid  Term labor symptoms and general obstetric precautions including but not limited to vaginal bleeding,  contractions, leaking of fluid and fetal movement were reviewed in detail with the patient. Please refer to After Visit Summary for other counseling recommendations.   Return in 1 week (on 01/21/2021) for [facility] in 2 weeks will need antenatal testing if still pregnant.  Future Appointments  Date Time Provider Department Center  01/21/2021  1:55 PM Berle Mull [facility]  01/27/2021  2:15 PM [facility] NST [facility]    Reva Bores, MD

## 2021-01-21 ENCOUNTER — Other Ambulatory Visit: Payer: Self-pay

## 2021-01-21 ENCOUNTER — Inpatient Hospital Stay (EMERGENCY_DEPARTMENT_HOSPITAL)
Admission: AD | Admit: 2021-01-21 | Discharge: 2021-01-21 | Disposition: A | Discharge status: Home / Self Care | Payer: Medicaid Other | Source: Home / Self Care | Attending: Obstetrics & Gynecology | Admitting: Obstetrics & Gynecology

## 2021-01-21 ENCOUNTER — Encounter (HOSPITAL_COMMUNITY): Payer: Self-pay | Admitting: Obstetrics & Gynecology

## 2021-01-21 ENCOUNTER — Ambulatory Visit (INDEPENDENT_AMBULATORY_CARE_PROVIDER_SITE_OTHER): Payer: Medicaid Other

## 2021-01-21 VITALS — BP 120/67 | HR 86 | Wt 163.0 lb

## 2021-01-21 DIAGNOSIS — Z3A39 39 weeks gestation of pregnancy: Secondary | ICD-10-CM | POA: Insufficient documentation

## 2021-01-21 DIAGNOSIS — O4690 Antepartum hemorrhage, unspecified, unspecified trimester: Secondary | ICD-10-CM | POA: Diagnosis not present

## 2021-01-21 DIAGNOSIS — Z603 Acculturation difficulty: Secondary | ICD-10-CM

## 2021-01-21 DIAGNOSIS — O26893 Other specified pregnancy related conditions, third trimester: Secondary | ICD-10-CM | POA: Insufficient documentation

## 2021-01-21 DIAGNOSIS — R109 Unspecified abdominal pain: Secondary | ICD-10-CM | POA: Insufficient documentation

## 2021-01-21 DIAGNOSIS — R300 Dysuria: Secondary | ICD-10-CM

## 2021-01-21 DIAGNOSIS — R102 Pelvic and perineal pain: Secondary | ICD-10-CM | POA: Insufficient documentation

## 2021-01-21 DIAGNOSIS — O479 False labor, unspecified: Secondary | ICD-10-CM

## 2021-01-21 DIAGNOSIS — Z3480 Encounter for supervision of other normal pregnancy, unspecified trimester: Secondary | ICD-10-CM

## 2021-01-21 DIAGNOSIS — O4693 Antepartum hemorrhage, unspecified, third trimester: Secondary | ICD-10-CM | POA: Insufficient documentation

## 2021-01-21 NOTE — Progress Notes (Signed)
Patient reports pain in lower abdomen when she is urinating. This has been going on for about 1 week.

## 2021-01-21 NOTE — MAU Note (Signed)
Having some ctxs and bloody show. Was checked today and was 1cm. Good FM

## 2021-01-21 NOTE — MAU Provider Note (Signed)
Chief Complaint:  Abdominal Pain and Vaginal Bleeding   Event Date/Time   First Provider Initiated Contact with Patient 01/21/21 2250     Interpretor used throughout  HPI: Jade Stafford is a 26 y.o. G3P2002 at 65w2dwho presents to maternity admissions reporting uterine contractions and vaginal bleeding. Did have an exam today. . She reports good fetal movement, denies LOF, urinary symptoms, h/a, dizziness, n/v, diarrhea, constipation or fever/chills.    Abdominal Pain The problem occurs intermittently. The problem has been unchanged. The pain is mild. The quality of the pain is cramping. The abdominal pain does not radiate. Pertinent negatives include no dysuria, fever or frequency. Nothing aggravates the pain. The pain is relieved by Nothing.  Vaginal Bleeding The patient's primary symptoms include pelvic pain and vaginal bleeding. The patient's pertinent negatives include no genital itching, genital lesions or genital odor. This is a new problem. The current episode started today. She is pregnant. Associated symptoms include abdominal pain. Pertinent negatives include no dysuria, fever or frequency. The vaginal discharge was bloody. The vaginal bleeding is spotting. She has not been passing clots. She has not been passing tissue. Nothing aggravates the symptoms. She has tried nothing for the symptoms.   RN Note: Having some ctxs and bloody show. Was checked today and was 1cm. Good FM  Past Medical History: Past Medical History:  Diagnosis Date   Medical history non-contributory     Past obstetric history: OB History  Gravida Para Term Preterm AB Living  3 2 2  0 0 2  SAB IAB Ectopic Multiple Live Births  0 0 0   2    # Outcome Date GA Lbr Len/2nd Weight Sex Delivery Anes PTL Lv  3 Current           2 Term      Vag-Spont   LIV  1 Term      Vag-Spont   LIV    Past Surgical History: Past Surgical History:  Procedure Laterality Date   TONSILLECTOMY      Family  History: Family History  Problem Relation Age of Onset   Cancer Neg Hx    Diabetes Neg Hx    Heart disease Neg Hx    Hypertension Neg Hx     Social History: Social History   Tobacco Use   Smoking status: Never   Smokeless tobacco: Never  Vaping Use   Vaping Use: Never used  Substance Use Topics   Alcohol use: Never   Drug use: Never    Allergies:  Allergies  Allergen Reactions   Other Swelling    Promegrante    Meds:  Medications Prior to Admission  Medication Sig Dispense Refill Last Dose   Prenatal 27-1 MG TABS Take 1 tablet by mouth daily. 30 tablet 11 01/21/2021    I have reviewed patient's Past Medical Hx, Surgical Hx, Family Hx, Social Hx, medications and allergies.   ROS:  Review of Systems  Constitutional:  Negative for fever.  Gastrointestinal:  Positive for abdominal pain.  Genitourinary:  Positive for pelvic pain and vaginal bleeding. Negative for dysuria and frequency.  Other systems negative  Physical Exam  Patient Vitals for the past 24 hrs:  BP Temp Pulse Resp SpO2 Height Weight  01/21/21 2210 114/67 -- 72 -- 100 % -- --  01/21/21 2117 107/66 -- -- -- -- -- --  01/21/21 2116 -- 98.2 F (36.8 C) 76 16 99 % 5' (1.524 m) 73.5 kg   Constitutional: Well-developed, well-nourished female  in no acute distress.  Cardiovascular: normal rate and rhythm Respiratory: normal effort GI: Abd soft, non-tender, gravid appropriate for gestational age.   No rebound or guarding. MS: Extremities nontender, no edema, normal ROM Neurologic: Alert and oriented x 4.  GU: Neg CVAT.  PELVIC EXAM:  No blood in vault. Dilation: 1 Effacement (%): 70 Station: -2 Presentation: Vertex Exam by:: Wynelle Bourgeois, CNM  FHT:  Baseline 135 , moderate variability, accelerations present, no decelerations Contractions: Irregular     Labs: No results found for this or any previous visit (from the past 24 hour(s)). A/Positive/-- (07/12 1015)  Imaging:    MAU  Course/MDM: NST reviewed, reassuring throughout DIscussed spotting was likely due to being examined.  Uterine contractions are not regular or strong.  Signs of labor were reviewed Treatments in MAU included EFM.    Assessment: SIngle IUP at [redacted]w[redacted]d Irregular uterine contractions No evidence of vaginal bleeding  Plan: Discharge home Labor precautions and fetal kick counts Follow up in Office for prenatal visits and recheck Encouraged to return if she develops worsening of symptoms, increase in pain, fever, or other concerning symptoms.  Pt stable at time of discharge.  Wynelle Bourgeois CNM, MSN Certified Nurse-Midwife 01/21/2021 10:50 PM

## 2021-01-21 NOTE — Progress Notes (Signed)
° °  PRENATAL VISIT NOTE  Subjective:  Jade Stafford is a 26 y.o. G3P2002 at [redacted]w[redacted]d being seen today for ongoing prenatal care.  She is currently monitored for the following issues for this low-risk pregnancy and has Language barrier, cultural differences; Supervision of other normal pregnancy, antepartum; and Marginal insertion of umbilical cord affecting management of mother on their problem list.  Patient reports intermittent pain with urination x1 week. Not every time she goes to the bathroom. No fever, back pain, or hematuria. Contractions: Irritability. Vag. Bleeding: None.  Movement: Present. Denies leaking of fluid.   The following portions of the patient's history were reviewed and updated as appropriate: allergies, current medications, past family history, past medical history, past social history, past surgical history and problem list.   Objective:   Vitals:   01/21/21 1414  BP: 120/67  Pulse: 86  Weight: 163 lb (73.9 kg)    Fetal Status: Fetal Heart Rate (bpm): 129   Movement: Present     General:  Alert, oriented and cooperative. Patient is in no acute distress.  Skin: Skin is warm and dry. No rash noted.   Cardiovascular: Normal heart rate noted  Respiratory: Normal respiratory effort, no problems with respiration noted  Abdomen: Soft, gravid, appropriate for gestational age.        Pelvic: Cervical exam performed in the presence of a chaperone Dilation: 1 Effacement (%): 50 Station: -3  Extremities: Normal range of motion.     Mental Status: Normal mood and affect. Normal behavior. Normal judgment and thought content.   Assessment and Plan:  Pregnancy: G3P2002 at [redacted]w[redacted]d 1. Supervision of other normal pregnancy, antepartum - Routine OB. Doing well. - Discussed IOL with patient today at length. Patient adamant that she does not want an induction. Discussed that if she does not go into spontaneous labor by 41 weeks, we would recommend IOL at that time. Patient is  agreeable to scheduling 41 week IOL today.  - Anticipatory guidance for upcoming appointments provided  2. [redacted] weeks gestation of pregnancy   3. Language barrier, cultural differences - In person interpretor used   4. Dysuria during pregnancy in third trimester  - Culture, OB Urine   Term labor symptoms and general obstetric precautions including but not limited to vaginal bleeding, contractions, leaking of fluid and fetal movement were reviewed in detail with the patient. Please refer to After Visit Summary for other counseling recommendations.   Return in about 1 week (around 01/28/2021).  Future Appointments  Date Time Provider [facility]  01/27/2021  2:15 PM [facility] NST [facility]  02/02/2021  6:30 AM [facility] None    Renee Harder, CNM

## 2021-01-21 NOTE — MAU Note (Signed)
I have communicated with Wynelle Bourgeois, CNM and reviewed vital signs:  Vitals:   01/21/21 2210 01/21/21 2245  BP: 114/67 116/73  Pulse: 72 85  Resp:    Temp:    SpO2: 100% 98%    Vaginal exam:  Dilation: 1 Effacement (%): 70 Station: -2 Presentation: Vertex Exam by:: Wynelle Bourgeois, CNM,   Also reviewed contraction pattern and that non-stress test is reactive.  It has been documented that patient has uterine irritability without contractions. Based on this report provider has given order for discharge.  A discharge order and diagnosis entered by a provider.   Labor discharge instructions reviewed with patient. Patient verbalized understanding on when to return to hospital.

## 2021-01-22 ENCOUNTER — Encounter (HOSPITAL_COMMUNITY): Payer: Self-pay | Admitting: Family Medicine

## 2021-01-22 ENCOUNTER — Telehealth (HOSPITAL_COMMUNITY): Payer: Self-pay | Admitting: *Deleted

## 2021-01-22 ENCOUNTER — Inpatient Hospital Stay (HOSPITAL_COMMUNITY): Payer: Medicaid Other | Admitting: Anesthesiology

## 2021-01-22 ENCOUNTER — Other Ambulatory Visit: Payer: Self-pay

## 2021-01-22 ENCOUNTER — Inpatient Hospital Stay (HOSPITAL_COMMUNITY)
Admission: AD | Admit: 2021-01-22 | Discharge: 2021-01-23 | DRG: 807 | Disposition: A | Discharge status: Home / Self Care | Payer: Medicaid Other | Attending: Family Medicine | Admitting: Family Medicine

## 2021-01-22 DIAGNOSIS — Z3A39 39 weeks gestation of pregnancy: Secondary | ICD-10-CM

## 2021-01-22 DIAGNOSIS — O4202 Full-term premature rupture of membranes, onset of labor within 24 hours of rupture: Secondary | ICD-10-CM | POA: Diagnosis not present

## 2021-01-22 DIAGNOSIS — O43123 Velamentous insertion of umbilical cord, third trimester: Secondary | ICD-10-CM | POA: Diagnosis present

## 2021-01-22 DIAGNOSIS — O43199 Other malformation of placenta, unspecified trimester: Secondary | ICD-10-CM | POA: Diagnosis present

## 2021-01-22 DIAGNOSIS — O26893 Other specified pregnancy related conditions, third trimester: Secondary | ICD-10-CM | POA: Diagnosis present

## 2021-01-22 DIAGNOSIS — Z3490 Encounter for supervision of normal pregnancy, unspecified, unspecified trimester: Secondary | ICD-10-CM | POA: Insufficient documentation

## 2021-01-22 DIAGNOSIS — Z603 Acculturation difficulty: Secondary | ICD-10-CM

## 2021-01-22 DIAGNOSIS — Z20822 Contact with and (suspected) exposure to covid-19: Secondary | ICD-10-CM | POA: Diagnosis present

## 2021-01-22 DIAGNOSIS — Z3480 Encounter for supervision of other normal pregnancy, unspecified trimester: Secondary | ICD-10-CM

## 2021-01-22 LAB — CBC
HCT: 37.5 % (ref 36.0–46.0)
Hemoglobin: 12.8 g/dL (ref 12.0–15.0)
MCH: 30.3 pg (ref 26.0–34.0)
MCHC: 34.1 g/dL (ref 30.0–36.0)
MCV: 88.7 fL (ref 80.0–100.0)
Platelets: 242 10*3/uL (ref 150–400)
RBC: 4.23 MIL/uL (ref 3.87–5.11)
RDW: 13.3 % (ref 11.5–15.5)
WBC: 11.7 10*3/uL — ABNORMAL HIGH (ref 4.0–10.5)
nRBC: 0 % (ref 0.0–0.2)

## 2021-01-22 LAB — RESP PANEL BY RT-PCR (FLU A&B, COVID) ARPGX2
Influenza A by PCR: NEGATIVE
Influenza B by PCR: NEGATIVE
SARS Coronavirus 2 by RT PCR: NEGATIVE

## 2021-01-22 LAB — TYPE AND SCREEN
ABO/RH(D): A POS
Antibody Screen: NEGATIVE

## 2021-01-22 MED ORDER — ONDANSETRON HCL 4 MG PO TABS: 4.0000 mg | ORAL_TABLET | ORAL | Status: DC | PRN

## 2021-01-22 MED ORDER — BENZOCAINE-MENTHOL 20-0.5 % EX AERO: 1.0000 "application " | INHALATION_SPRAY | CUTANEOUS | Status: DC | PRN

## 2021-01-22 MED ORDER — COCONUT OIL OIL: 1.0000 "application " | TOPICAL_OIL | Status: DC | PRN

## 2021-01-22 MED ORDER — ONDANSETRON HCL 4 MG/2ML IJ SOLN: 4.0000 mg | INTRAMUSCULAR | Status: DC | PRN

## 2021-01-22 MED ORDER — SOD CITRATE-CITRIC ACID 500-334 MG/5ML PO SOLN: 30.0000 mL | ORAL | Status: DC | PRN

## 2021-01-22 MED ORDER — LACTATED RINGERS IV SOLN: 500.0000 mL | INTRAVENOUS | Status: DC | PRN

## 2021-01-22 MED ORDER — LIDOCAINE HCL (PF) 1 % IJ SOLN
INTRAMUSCULAR | Status: DC | PRN
  Administered 2021-01-22: 10 mL via EPIDURAL

## 2021-01-22 MED ORDER — DIPHENHYDRAMINE HCL 50 MG/ML IJ SOLN: 12.5000 mg | INTRAMUSCULAR | Status: DC | PRN

## 2021-01-22 MED ORDER — PRENATAL MULTIVITAMIN CH
1.0000 | ORAL_TABLET | Freq: Every day | ORAL | Status: DC
  Administered 2021-01-23: 1 via ORAL
  Filled 2021-01-22: qty 1

## 2021-01-22 MED ORDER — OXYTOCIN-SODIUM CHLORIDE 30-0.9 UT/500ML-% IV SOLN
2.5000 [IU]/h | INTRAVENOUS | Status: DC
  Administered 2021-01-22: 2.5 [IU]/h via INTRAVENOUS
  Filled 2021-01-22: qty 500

## 2021-01-22 MED ORDER — ONDANSETRON HCL 4 MG/2ML IJ SOLN: 4.0000 mg | Freq: Four times a day (QID) | INTRAMUSCULAR | Status: DC | PRN

## 2021-01-22 MED ORDER — PHENYLEPHRINE 40 MCG/ML (10ML) SYRINGE FOR IV PUSH (FOR BLOOD PRESSURE SUPPORT): 80.0000 ug | PREFILLED_SYRINGE | INTRAVENOUS | Status: DC | PRN

## 2021-01-22 MED ORDER — OXYCODONE-ACETAMINOPHEN 5-325 MG PO TABS: 2.0000 | ORAL_TABLET | ORAL | Status: DC | PRN

## 2021-01-22 MED ORDER — LACTATED RINGERS IV SOLN
500.0000 mL | Freq: Once | INTRAVENOUS | Status: AC
  Administered 2021-01-22: 500 mL via INTRAVENOUS

## 2021-01-22 MED ORDER — DIBUCAINE (PERIANAL) 1 % EX OINT: 1.0000 "application " | TOPICAL_OINTMENT | CUTANEOUS | Status: DC | PRN

## 2021-01-22 MED ORDER — IBUPROFEN 600 MG PO TABS
600.0000 mg | ORAL_TABLET | Freq: Four times a day (QID) | ORAL | Status: DC
  Administered 2021-01-22 – 2021-01-23 (×4): 600 mg via ORAL
  Filled 2021-01-22 (×4): qty 1

## 2021-01-22 MED ORDER — EPHEDRINE 5 MG/ML INJ: 10.0000 mg | INTRAVENOUS | Status: DC | PRN

## 2021-01-22 MED ORDER — FLEET ENEMA 7-19 GM/118ML RE ENEM: 1.0000 | ENEMA | RECTAL | Status: DC | PRN

## 2021-01-22 MED ORDER — SENNOSIDES-DOCUSATE SODIUM 8.6-50 MG PO TABS
2.0000 | ORAL_TABLET | Freq: Every day | ORAL | Status: DC
  Administered 2021-01-23: 2 via ORAL
  Filled 2021-01-22: qty 2

## 2021-01-22 MED ORDER — OXYCODONE-ACETAMINOPHEN 5-325 MG PO TABS: 1.0000 | ORAL_TABLET | ORAL | Status: DC | PRN

## 2021-01-22 MED ORDER — LACTATED RINGERS IV SOLN: INTRAVENOUS | Status: DC

## 2021-01-22 MED ORDER — LIDOCAINE HCL (PF) 1 % IJ SOLN: 30.0000 mL | INTRAMUSCULAR | Status: DC | PRN

## 2021-01-22 MED ORDER — OXYTOCIN BOLUS FROM INFUSION
333.0000 mL | Freq: Once | INTRAVENOUS | Status: AC
  Administered 2021-01-22: 333 mL via INTRAVENOUS

## 2021-01-22 MED ORDER — FENTANYL-BUPIVACAINE-NACL 0.5-0.125-0.9 MG/250ML-% EP SOLN
12.0000 mL/h | EPIDURAL | Status: DC | PRN
  Administered 2021-01-22: 12 mL/h via EPIDURAL
  Filled 2021-01-22: qty 250

## 2021-01-22 MED ORDER — DIPHENHYDRAMINE HCL 25 MG PO CAPS: 25.0000 mg | ORAL_CAPSULE | Freq: Four times a day (QID) | ORAL | Status: DC | PRN

## 2021-01-22 MED ORDER — TETANUS-DIPHTH-ACELL PERTUSSIS 5-2.5-18.5 LF-MCG/0.5 IM SUSY: 0.5000 mL | PREFILLED_SYRINGE | Freq: Once | INTRAMUSCULAR | Status: DC

## 2021-01-22 MED ORDER — WITCH HAZEL-GLYCERIN EX PADS: 1.0000 "application " | MEDICATED_PAD | CUTANEOUS | Status: DC | PRN

## 2021-01-22 MED ORDER — ACETAMINOPHEN 325 MG PO TABS: 650.0000 mg | ORAL_TABLET | ORAL | Status: DC | PRN

## 2021-01-22 MED ORDER — ZOLPIDEM TARTRATE 5 MG PO TABS: 5.0000 mg | ORAL_TABLET | Freq: Every evening | ORAL | Status: DC | PRN

## 2021-01-22 MED ORDER — ACETAMINOPHEN 325 MG PO TABS
650.0000 mg | ORAL_TABLET | ORAL | Status: DC | PRN
  Administered 2021-01-22 – 2021-01-23 (×3): 650 mg via ORAL
  Filled 2021-01-22 (×3): qty 2

## 2021-01-22 MED ORDER — PHENYLEPHRINE 40 MCG/ML (10ML) SYRINGE FOR IV PUSH (FOR BLOOD PRESSURE SUPPORT)
80.0000 ug | PREFILLED_SYRINGE | INTRAVENOUS | Status: DC | PRN
  Filled 2021-01-22: qty 10

## 2021-01-22 MED ORDER — SIMETHICONE 80 MG PO CHEW: 80.0000 mg | CHEWABLE_TABLET | ORAL | Status: DC | PRN

## 2021-01-22 NOTE — Anesthesia Preprocedure Evaluation (Signed)

## 2021-01-22 NOTE — Telephone Encounter (Signed)
Preadmission screen  

## 2021-01-22 NOTE — Anesthesia Procedure Notes (Signed)
Epidural Patient location during procedure: OB Start time: 01/22/2021 3:57 PM End time: 01/22/2021 4:08 PM  Staffing Anesthesiologist: Lucretia Kern, MD Performed: anesthesiologist   Preanesthetic Checklist Completed: patient identified, IV checked, risks and benefits discussed, monitors and equipment checked, pre-op evaluation and timeout performed  Epidural Patient position: sitting Prep: DuraPrep Patient monitoring: heart rate, continuous pulse ox and blood pressure Approach: midline Location: L3-L4 Injection technique: LOR air  Needle:  Needle type: Tuohy  Needle gauge: 17 G Needle length: 9 cm Needle insertion depth: 4 cm Catheter type: closed end flexible Catheter size: 19 Gauge Catheter at skin depth: 9 cm Test dose: negative  Assessment Events: blood not aspirated, injection not painful, no injection resistance, no paresthesia and negative IV test  Additional Notes Reason for block:procedure for pain

## 2021-01-22 NOTE — H&P (Shared)
OBSTETRIC ADMISSION HISTORY AND PHYSICAL  Jade Stafford is a 26 y.o. female [id number] with IUP at [redacted]w[redacted]d by U/S presenting for SOL with SROM with meconium stained fluid. She reports +FMs, No LOF, no VB, no blurry vision, headaches or peripheral edema, and RUQ pain***.  She plans on breast feeding. She request *** for birth control. She received her prenatal care at [facility]   Dating: By U/S --->  Estimated Date of Delivery: 01/26/21  Sono:    @[redacted]w[redacted]d , CWD, normal anatomy, cephalic presentation, posterior placenta, 2465g, 16% EFW   Prenatal History/Complications: *** -Marginal insertion of umbilical cord, serial growth U/Ss were all CWDs -Food insecurity, referred to    Past Medical History: Past Medical History:  Diagnosis Date   Medical history non-contributory     Past Surgical History: Past Surgical History:  Procedure Laterality Date   TONSILLECTOMY      Obstetrical History: OB History     Gravida  3   Para  2   Term  2   Preterm  0   AB  0   Living  2      SAB  0   IAB  0   Ectopic  0   Multiple      Live Births  2           Social History Social History   Socioeconomic History   Marital status: Married    Spouse name: Not on file   Number of children: Not on file   Years of education: Not on file   Highest education level: Not on file  Occupational History   Not on file  Tobacco Use   Smoking status: Never   Smokeless tobacco: Never  Vaping Use   Vaping Use: Never used  Substance and Sexual Activity   Alcohol use: Never   Drug use: Never   Sexual activity: Yes    Birth control/protection: None  Other Topics Concern   Not on file  Social History Narrative   ** Merged History Encounter **       Social Determinants of Health   Financial Resource Strain: Not on file  Food Insecurity: Food Insecurity Present   Worried About Running Out of Food in the Last Year: Sometimes true   Ran Out of Food in the  Last Year: Sometimes true  Transportation Needs: No Transportation Needs   Lack of Transportation (Medical): No   Lack of Transportation (Non-Medical): No  Physical Activity: Not on file  Stress: Not on file  Social Connections: Not on file    Family History: Family History  Problem Relation Age of Onset   Cancer Neg Hx    Diabetes Neg Hx    Heart disease Neg Hx    Hypertension Neg Hx     Allergies: Allergies  Allergen Reactions   Other Swelling    Promegrante    Medications Prior to Admission  Medication Sig Dispense Refill Last Dose   Prenatal 27-1 MG TABS Take 1 tablet by mouth daily. 30 tablet 11      Review of Systems   All systems reviewed and negative except as stated in HPI  Last menstrual period 04/25/2020. General appearance: {general exam:16600} Lungs: clear to auscultation bilaterally Heart: regular rate and rhythm Abdomen: soft, non-tender; bowel sounds normal Pelvic: *** Extremities: Homans sign is negative, no sign of DVT DTR's *** Presentation: cephalic Fetal monitoringBaseline: 150 bpm, Variability: Good {> 6 bpm), Accelerations: Reactive, and Decelerations: Absent Uterine  activity: ctx q 2-3 minutes lasting 60-120 sec Dilation: 3.5 Effacement (%): 70 Exam by:: Rory Percy, RN   Prenatal labs: ABO, Rh: A/Positive/-- (07/12 1015) Antibody: Negative (07/12 1015) Rubella: 14.20 (07/12 1015) RPR: Non Reactive (11/03 0835)  HBsAg: Negative (07/12 1015)  HIV: Non Reactive (11/03 0835)  GBS: Negative/-- (01/04 1643)  2 hr Glucola normal Genetic screening: NIPS, AFP, and Horizon all LR/negative Anatomy US normal  Prenatal Transfer Tool  Maternal Diabetes: No Genetic Screening: Normal Maternal Ultrasounds/Referrals: Normal Fetal Ultrasounds or other Referrals:  Referred to Materal Fetal Medicine  Maternal Substance Abuse:  No Significant Maternal Medications:  None Significant Maternal Lab Results: None  No results found for this or  any previous visit (from the past 24 hour(s)).  Patient Active Problem List   Diagnosis Date Noted   Pregnancy 01/22/2021   Marginal insertion of umbilical cord affecting management of mother 09/08/2020   Supervision of other normal pregnancy, antepartum 07/02/2020   Language barrier, cultural differences 06/18/2020    Assessment/Plan:  Jade Stafford is a 26 y.o. G3P2002 at [redacted]w[redacted]d here for***  #Labor:*** #Pain: *** #FWB: *** #ID:  *** #MOF: *** #MOC:*** #Circ:  Erick Alley, DO  01/22/2021, 3:07 PM

## 2021-01-22 NOTE — Discharge Summary (Signed)
Postpartum Discharge Summary     Patient Name: Jade Stafford DOB: Mar 03, 1995 MRN: [id number]  Date of admission: 01/22/2021 Delivery date:01/22/2021  Delivering provider: Olga Coaster  Date of discharge: 01/23/2021  Admitting diagnosis: Pregnancy [Z34.90] Intrauterine pregnancy: [redacted]w[redacted]d     Secondary diagnosis:  Principal Problem:   SVD (spontaneous vaginal delivery) Active Problems:   Language barrier, cultural differences   Supervision of other normal pregnancy, antepartum   Marginal insertion of umbilical cord affecting management of mother  Additional problems: None     Discharge diagnosis: Term Pregnancy Delivered                                              Post partum procedures: None Augmentation: N/A Complications: None  Hospital course: Onset of Labor With Vaginal Delivery      26 y.o. yo [id number] at [redacted]w[redacted]d was admitted in Latent Labor on 01/22/2021. Patient had an uncomplicated labor course as follows:  Membrane Rupture Time/Date: 2:20 PM ,01/22/2021   Delivery Method:Vaginal, Spontaneous  Episiotomy: None  Lacerations:  1st degree  Patient had an uncomplicated postpartum course.  She is ambulating, tolerating a regular diet, passing flatus, and urinating well. Patient is discharged home in stable condition on 01/23/21.  Newborn Data: Birth date:01/22/2021  Birth time:6:13 PM  Gender:Female  Living status:Living  Apgars:9 ,9  Weight:2940 g   Magnesium Sulfate received: No BMZ received: No Rhophylac: N/A MMR: N/A T-DaP: Given prenatally Flu: No Transfusion: No  Physical exam  Vitals:   01/23/21 0107 01/23/21 0541 01/23/21 0935 01/23/21 1415  BP: (!) 101/58 99/61 103/72 (!) 99/57  Pulse: 73 61 71 72  Resp: $Remo'16 17 18 18  'MgJis$ Temp: 97.7 F (36.5 C) 98.2 F (36.8 C) (!) 97.4 F (36.3 C) 98 F (36.7 C)  TempSrc: Oral Oral Oral Oral  SpO2: 99% 98% 98% 99%   General: alert, cooperative, and no distress Lochia: appropriate Uterine Fundus: firm and  below umbilicus  DVT Evaluation: no LE edema or calf tenderness to palpation  Labs: Lab Results  Component Value Date   WBC 11.7 (H) 01/22/2021   HGB 12.8 01/22/2021   HCT 37.5 01/22/2021   MCV 88.7 01/22/2021   PLT 242 01/22/2021   CMP Latest Ref Rng & Units 02/06/2020  Glucose 65 - 99 mg/dL 80  BUN 6 - 20 mg/dL 10  Creatinine 0.57 - 1.00 mg/dL 0.58  Sodium 134 - 144 mmol/L 140  Potassium 3.5 - 5.2 mmol/L 4.6  Chloride 96 - 106 mmol/L 103  CO2 20 - 29 mmol/L 25  Calcium 8.7 - 10.2 mg/dL 9.0   Edinburgh Score: Edinburgh Postnatal Depression Scale Screening Tool 01/23/2021  I have been able to laugh and see the funny side of things. 0  I have looked forward with enjoyment to things. 0  I have blamed myself unnecessarily when things went wrong. 2  I have been anxious or worried for no good reason. 0  I have felt scared or panicky for no good reason. 0  Things have been getting on top of me. 0  I have been so unhappy that I have had difficulty sleeping. 0  I have felt sad or miserable. 0  I have been so unhappy that I have been crying. 0  The thought of harming myself has occurred to me. 0  Edinburgh Postnatal Depression Scale  Total 2     After visit meds:  Allergies as of 01/23/2021       Reactions   Other Swelling   Promegrante        Medication List     TAKE these medications    acetaminophen 500 MG tablet Commonly known as: TYLENOL Take 2 tablets (1,000 mg total) by mouth every 8 (eight) hours as needed (pain).   ibuprofen 600 MG tablet Commonly known as: ADVIL Take 1 tablet (600 mg total) by mouth every 6 (six) hours as needed (pain).   oxyCODONE 5 MG immediate release tablet Commonly known as: Oxy IR/ROXICODONE Take 1 tablet (5 mg total) by mouth every 6 (six) hours as needed for severe pain or breakthrough pain.   Prenatal 27-1 MG Tabs Take 1 tablet by mouth daily.         Discharge home in stable condition Infant Feeding: Breast Infant  Disposition: home with mother Discharge instruction: per After Visit Summary and Postpartum booklet. Activity: Advance as tolerated. Pelvic rest for 6 weeks.  Diet: routine diet Future Appointments: Future Appointments  Date Time Provider Downs  01/27/2021  2:15 PM [facility]   Follow up Visit: Please schedule this patient for a In person postpartum visit in 6 weeks with the following provider: Any provider. Additional Postpartum F/U:  None   Low risk pregnancy complicated by:  Marginal cord insertion Delivery mode:  Vaginal, Spontaneous  Anticipated Birth Control:  IUD outpatient  01/23/2021 Genia Del, MD

## 2021-01-22 NOTE — Progress Notes (Signed)
Presents with c/o regular ctxs and LOF since 1420.  Assisted into bed & EFM applied, meconium stained fluid noted and ctxs every 2-3 minutes.

## 2021-01-22 NOTE — H&P (Addendum)
OBSTETRIC ADMISSION HISTORY AND PHYSICAL  Jade Stafford is a 26 y.o. female [phone] with IUP at [redacted]w[redacted]d by U/S presenting for SOL with SROM @ 1430 with meconium stained fluid. She reports +FMs, No LOF, no VB, no blurry vision, headaches or peripheral edema, and RUQ pain. She is her with her husband from [place].  She plans on breast feeding. She request IUD for birth control at postpartum visit. She received her prenatal care at [facility]   Dating: By U/S --->  Estimated Date of Delivery: 01/26/21  Sono:   @[redacted]w[redacted]d , CWD, normal anatomy, cephalic presentation, posterior placenta, 2465g, 16% EFW   Prenatal History/Complications:  -Marginal insertion of umbilical cord, serial growth U/Ss were all CWDs -Food insecurity, referred to [employer]    Past Medical History: Past Medical History:  Diagnosis Date   Medical history non-contributory     Past Surgical History: Past Surgical History:  Procedure Laterality Date   TONSILLECTOMY      Obstetrical History: OB History     Gravida  3   Para  2   Term  2   Preterm  0   AB  0   Living  2      SAB  0   IAB  0   Ectopic  0   Multiple      Live Births  2           Social History Social History   Socioeconomic History   Marital status: Married    Spouse name: Not on file   Number of children: Not on file   Years of education: Not on file   Highest education level: Not on file  Occupational History   Not on file  Tobacco Use   Smoking status: Never   Smokeless tobacco: Never  Vaping Use   Vaping Use: Never used  Substance and Sexual Activity   Alcohol use: Never   Drug use: Never   Sexual activity: Yes    Birth control/protection: None  Other Topics Concern   Not on file  Social History Narrative   ** Merged History Encounter **       Social Determinants of Health   Financial Resource Strain: Not on file  Food Insecurity: Food Insecurity Present   Worried About [place] in the Last Year: Sometimes true   [place] in the Last Year: Sometimes true  Transportation Needs: No Transportation Needs   Lack of Transportation (Medical): No   Lack of Transportation (Non-Medical): No  Physical Activity: Not on file  Stress: Not on file  Social Connections: Not on file    Family History: Family History  Problem Relation Age of Onset   Cancer Neg Hx    Diabetes Neg Hx    Heart disease Neg Hx    Hypertension Neg Hx     Allergies: Allergies  Allergen Reactions   Other Swelling    Promegrante    Medications Prior to Admission  Medication Sig Dispense Refill Last Dose   Prenatal 27-1 MG TABS Take 1 tablet by mouth daily. 30 tablet 11      Review of Systems   All systems reviewed and negative except as stated in HPI  Blood pressure 91/80, pulse 75, temperature 98 F (36.7 C), temperature source Oral, resp. rate 16, last menstrual period 04/25/2020, SpO2 98 %. General appearance: alert and mild distress Lungs: clear to auscultation bilaterally Heart: RRR w/o M/G/R Abdomen: soft, non-tender; bowel  sounds normal Pelvic: deferred as this time, recent SVE by RN.  Hx of SVD x 2. Extremities:  no sign of DVT Presentation: cephalic Fetal monitoringBaseline: 150 bpm, Variability: Good {> 6 bpm), Accelerations: Reactive, and Decelerations: Absent Uterine activity: ctx q 2-3 minutes lasting 60-120 sec Dilation: 4.5 Effacement (%): 80 Station: 0 Exam by:: Darliss Ridgel RN   Prenatal labs: ABO, Rh: --/--/A POS (01/20 1450) Antibody: NEG (01/20 1450) Rubella: 14.20 (07/12 1015) RPR: Non Reactive (11/03 0835)  HBsAg: Negative (07/12 1015)  HIV: Non Reactive (11/03 0835)  GBS: Negative/-- (01/04 1643)  2 hr Glucola normal Genetic screening: NIPS, AFP, and Horizon all LR/negative Anatomy US normal  Prenatal Transfer Tool  Maternal Diabetes: No Genetic Screening: Normal Maternal Ultrasounds/Referrals: Normal Fetal Ultrasounds or other  Referrals:  Referred to Materal Fetal Medicine  Maternal Substance Abuse:  No Significant Maternal Medications:  None Significant Maternal Lab Results: None  Results for orders placed or performed during the hospital encounter of 01/22/21 (from the past 24 hour(s))  Type and screen [place]   Collection Time: 01/22/21  2:50 PM  Result Value Ref Range   ABO/RH(D) A POS    Antibody Screen NEG    Sample Expiration      01/25/2021,2359 Performed at [facility], [address], [place], [place] [address]   Resp Panel by RT-PCR (Flu A&B, Covid) Nasopharyngeal Swab   Collection Time: 01/22/21  2:51 PM   Specimen: Nasopharyngeal Swab; Nasopharyngeal(NP) swabs in vial transport medium  Result Value Ref Range   SARS Coronavirus 2 by RT PCR NEGATIVE NEGATIVE   Influenza A by PCR NEGATIVE NEGATIVE   Influenza B by PCR NEGATIVE NEGATIVE  CBC   Collection Time: 01/22/21  2:51 PM  Result Value Ref Range   WBC 11.7 (H) 4.0 - 10.5 K/uL   RBC 4.23 3.87 - 5.11 MIL/uL   Hemoglobin 12.8 12.0 - 15.0 g/dL   HCT 37.5 36.0 - 46.0 %   MCV 88.7 80.0 - 100.0 fL   MCH 30.3 26.0 - 34.0 pg   MCHC 34.1 30.0 - 36.0 g/dL   RDW 13.3 11.5 - 15.5 %   Platelets 242 150 - 400 K/uL   nRBC 0.0 0.0 - 0.2 %    Patient Active Problem List   Diagnosis Date Noted   Pregnancy 01/22/2021   Marginal insertion of umbilical cord affecting management of mother 09/08/2020   Supervision of other normal pregnancy, antepartum 07/02/2020   Language barrier, cultural differences 06/18/2020    Assessment/Plan:  Jade Stafford is a 26 y.o. G3P2002 at [redacted]w[redacted]d here for SOL with SROM @ 1430 with meconium stained fluid.  #Labor:SOL continue expectant management, continue to assess #Pain: Desires epidural, CBC pending per anesthesia protocol #FWB: Cat 1 #ID:  GBS negative #MOF: Breast #MOC:PP outpatient IUD placement  LaKasha White, Student-MidWife  01/22/2021, 5:32 PM    Attestation of  CNM Supervision of midwife: Evaluation and management procedures were performed by the midwife under my supervision. I was immediately available for direct supervision, assistance and direction throughout this encounter.  I also confirm that I have verified the information documented in the residents note, and that I have also personally reperformed the pertinent components of the physical exam and all of the medical decision making activities.  I have also made any necessary editorial changes.  Renee Harder, CNM 01/22/2021 6:46 PM

## 2021-01-23 LAB — CULTURE, OB URINE

## 2021-01-23 LAB — URINE CULTURE, OB REFLEX

## 2021-01-23 LAB — SYPHILIS: RPR W/REFLEX TO RPR TITER AND TREPONEMAL ANTIBODIES, TRADITIONAL SCREENING AND DIAGNOSIS ALGORITHM: RPR Ser Ql: NONREACTIVE

## 2021-01-23 MED ORDER — IBUPROFEN 600 MG PO TABS: 600.0000 mg | ORAL_TABLET | Freq: Four times a day (QID) | ORAL | 0 refills | Status: AC | PRN

## 2021-01-23 MED ORDER — ACETAMINOPHEN 500 MG PO TABS: 1000.0000 mg | ORAL_TABLET | Freq: Three times a day (TID) | ORAL | 0 refills | Status: AC | PRN

## 2021-01-23 MED ORDER — OXYCODONE HCL 5 MG PO TABS
5.0000 mg | ORAL_TABLET | ORAL | Status: DC | PRN
  Administered 2021-01-23: 5 mg via ORAL
  Filled 2021-01-23: qty 1

## 2021-01-23 MED ORDER — OXYCODONE HCL 5 MG PO TABS: 5.0000 mg | ORAL_TABLET | Freq: Four times a day (QID) | ORAL | 0 refills | Status: AC | PRN

## 2021-01-23 NOTE — Anesthesia Postprocedure Evaluation (Signed)
Anesthesia Post Note  Patient: Jade Stafford  Procedure(s) Performed: AN AD [place]     Patient location during evaluation: Mother Baby Anesthesia Type: Epidural Level of consciousness: awake and alert Pain management: pain level controlled Vital Signs Assessment: post-procedure vital signs reviewed and stable Respiratory status: spontaneous breathing, nonlabored ventilation and respiratory function stable Cardiovascular status: stable Postop Assessment: no headache, no backache and epidural receding Anesthetic complications: no   No notable events documented.  Last Vitals:  Vitals:   01/23/21 0107 01/23/21 0541  BP: (!) 101/58 99/61  Pulse: 73 61  Resp: 16 17  Temp: 36.5 C 36.8 C  SpO2: 99% 98%    Last Pain:  Vitals:   01/23/21 0541  TempSrc: Oral  PainSc: 0-No pain   Pain Goal:                   [name]

## 2021-01-25 ENCOUNTER — Encounter: Payer: Self-pay | Admitting: *Deleted

## 2021-01-27 ENCOUNTER — Other Ambulatory Visit: Payer: Medicaid Other

## 2021-02-02 ENCOUNTER — Ambulatory Visit (HOSPITAL_COMMUNITY): Payer: Medicaid Other

## 2021-02-04 ENCOUNTER — Telehealth (HOSPITAL_COMMUNITY): Payer: Self-pay

## 2021-02-04 NOTE — Telephone Encounter (Signed)
No answer. Left message to return nurse call.  Sharyn Lull [facility] 02/04/2021,1644

## 2021-02-12 ENCOUNTER — Ambulatory Visit: Payer: Medicaid Other

## 2021-11-30 NOTE — Congregational Nurse Program (Signed)
  Dept: [phone]   Congregational Nurse Program Note  Date of Encounter: 11/30/2021  Past Medical History: Past Medical History:  Diagnosis Date   Medical history non-contributory     Encounter Details:  CNP Questionnaire - 11/30/21 1158       Questionnaire   Ask client: Do you give verbal consent for me to treat you today? Yes    Student Assistance N/A    Location Patient Served  [facility]    Visit Setting with Client Organization    Patient Status Refugee    Insurance Medicaid    Insurance/Financial Assistance Referral N/A    Medication N/A    Medical Provider Yes    Screening Referrals Made Annual Wellness Visit    Medical Referrals Made N/A    Medical Appointment Made Other    Recently w/o PCP, now 1st time PCP visit completed due to CNs referral or appointment made N/A    Food N/A    Transportation N/A    Housing/Utilities N/A    Interpersonal Safety N/A    Interventions Advocate/Support;Navigate Healthcare System;Case Management;Counsel;Educate;Spiritual Care    Abnormal to Normal Screening Since Last CN Visit N/A    Screenings CN Performed N/A    Sent Client to Lab for: N/A    Did client attend any of the following based off CNs referral or appointments made? N/A    ED Visit Averted Yes    Life-Saving Intervention Made N/A           Patient is requesting to see her OBGYN for frequent menses and for family planning. Appointment scheduled.  Nicole Cella Sukhman Martine RN BSN PCCN  [facility] [phone]

## 2022-01-06 ENCOUNTER — Ambulatory Visit (INDEPENDENT_AMBULATORY_CARE_PROVIDER_SITE_OTHER): Payer: Medicaid Other | Admitting: Obstetrics and Gynecology

## 2022-01-06 ENCOUNTER — Other Ambulatory Visit (HOSPITAL_COMMUNITY)
Admission: RE | Admit: 2022-01-06 | Discharge: 2022-01-06 | Disposition: A | Discharge status: Home / Self Care | Payer: Medicaid Other | Source: Ambulatory Visit | Attending: Obstetrics and Gynecology | Admitting: Obstetrics and Gynecology

## 2022-01-06 VITALS — BP 119/78 | HR 87

## 2022-01-06 DIAGNOSIS — Z3202 Encounter for pregnancy test, result negative: Secondary | ICD-10-CM

## 2022-01-06 DIAGNOSIS — Z603 Acculturation difficulty: Secondary | ICD-10-CM | POA: Diagnosis not present

## 2022-01-06 DIAGNOSIS — N898 Other specified noninflammatory disorders of vagina: Secondary | ICD-10-CM | POA: Diagnosis present

## 2022-01-06 DIAGNOSIS — Z3042 Encounter for surveillance of injectable contraceptive: Secondary | ICD-10-CM

## 2022-01-06 LAB — POCT PREGNANCY, URINE: Preg Test, Ur: NEGATIVE

## 2022-01-06 MED ORDER — MEDROXYPROGESTERONE ACETATE 150 MG/ML IM SUSP
150.0000 mg | Freq: Once | INTRAMUSCULAR | Status: AC
  Administered 2022-01-06: 150 mg via INTRAMUSCULAR

## 2022-01-06 NOTE — Progress Notes (Signed)
Cochran here for Depo-Provera Injection. Injection administered without complication. Patient will return in 3 months for next injection between 03/22 and 04/05.  Martina Sinner, RN 01/06/2022  4:16 PM

## 2022-01-06 NOTE — Progress Notes (Signed)
    GYNECOLOGY VISIT  Patient name: Jade Stafford MRN [id number]  Date of birth: 13-Aug-1995 Chief Complaint:   Vaginal Bleeding  History:  Jade Stafford is a 27 y.o. [phone] being seen today for change in contraception method. Has been exclusively breastfeeding and has not had a real menses. Sure she is not pregnant and is interested in starting contraception.  Does not want nexplanon due to reported mood disruption by friends. Used natural family planning. Initially considering IUD.   Past Medical History:  Diagnosis Date   Medical history non-contributory     Past Surgical History:  Procedure Laterality Date   TONSILLECTOMY      The following portions of the patient's history were reviewed and updated as appropriate: allergies, current medications, past family history, past medical history, past social history, past surgical history and problem list.   Health Maintenance:   Last pap 05/2020. Results were: NILM w/ HRHPV not done.  Last mammogram: n/a   Review of Systems:  Pertinent items are noted in HPI. Comprehensive review of systems was otherwise negative.   Objective:  Physical Exam BP 119/78   Pulse 87   Breastfeeding Yes    Physical Exam Vitals and nursing note reviewed.  Constitutional:      Appearance: Normal appearance.  HENT:     Head: Normocephalic and atraumatic.  Pulmonary:     Effort: Pulmonary effort is normal.  Skin:    General: Skin is warm and dry.  Neurological:     General: No focal deficit present.     Mental Status: She is alert.  Psychiatric:        Mood and Affect: Mood normal.        Behavior: Behavior normal.        Thought Content: Thought content normal.        Judgment: Judgment normal.     Assessment & Plan:   1. Encounter for management and injection of depo-Provera After discussion of various contraceptive methods, patient decided to proceed with depo-provera for contraception. Given additional  information regarding various forms of contraception as well. All questions answered regarding contraception and expected bleeding pattern answered.  - Pregnancy, urine POC - medroxyPROGESTERone (DEPO-PROVERA) injection 150 mg  2. Vaginal discharge Vaginitis swab for reported vaginal dsicharge as well - Cervicovaginal ancillary only( [place])  3. Language barrier, cultural differences In person interpreter used for duration of visit    Routine preventative health maintenance measures emphasized.  Lorriane Shire, MD [facility] for [facility], [facility]

## 2022-01-07 LAB — CERVICOVAGINAL ANCILLARY ONLY
Bacterial Vaginitis (gardnerella): POSITIVE — AB
Candida Glabrata: NEGATIVE
Candida Vaginitis: NEGATIVE
Comment: NEGATIVE
Comment: NEGATIVE
Comment: NEGATIVE

## 2022-01-10 ENCOUNTER — Other Ambulatory Visit: Payer: Self-pay | Admitting: Obstetrics and Gynecology

## 2022-01-10 DIAGNOSIS — B9689 Other specified bacterial agents as the cause of diseases classified elsewhere: Secondary | ICD-10-CM

## 2022-01-10 MED ORDER — METRONIDAZOLE 0.75 % VA GEL: 1.0000 | Freq: Every day | VAGINAL | 1 refills | Status: AC

## 2022-01-19 DIAGNOSIS — R42 Dizziness and giddiness: Secondary | ICD-10-CM

## 2022-01-19 LAB — GLUCOSE, POCT (MANUAL RESULT ENTRY): POC Glucose: 90 mg/dl (ref 70–99)

## 2022-01-19 NOTE — Congregational Nurse Program (Signed)
  Dept: [phone]   Congregational Nurse Program Note  Date of Encounter: 01/19/2022  Past Medical History: Past Medical History:  Diagnosis Date   Medical history non-contributory     Encounter Details:  CNP Questionnaire - 01/19/22 1113       Questionnaire   Ask client: Do you give verbal consent for me to treat you today? Yes    Student Assistance N/A    Location Patient Served  [facility]    Visit Setting with Client Organization    Patient Status Refugee    Insurance Medicaid    Insurance/Financial Assistance Referral N/A    Medication N/A    Medical Provider Yes    Screening Referrals Made N/A    Medical Referrals Made [facility] PCP[facility]Clinic    Medical Appointment Made [facility] PCP[facility]clinic    Recently w/o PCP, now 1st time PCP visit completed due to CNs referral or appointment made N/A    Food N/A    Transportation N/A    Housing/Utilities N/A    Interpersonal Safety N/A    Interventions Advocate/Support;Navigate Healthcare System;Case Management;Counsel;Educate    Abnormal to Normal Screening Since Last CN Visit N/A    Screenings CN Performed Blood Pressure;Blood Glucose    Sent Client to Lab for: N/A    Did client attend any of the following based off CNs referral or appointments made? N/A    ED Visit Averted Yes    Life-Saving Intervention Made N/A            Patient came to see congregational RN with c/o dizziness and left arm pain that resolved. BP 100/69 and blood sugar 90.  Patient is fasting for religious reasons. She has agreed to break the fast. Encouraged to drink enough liquids to rehydrate. Educated on postural hypotension precautions. Appointment with PCP scheduled for  March 11th 2024 at 09:55am  Earlie Server Summer Mccolgan RN BSN [place] Nurse [phone]

## 2022-03-14 ENCOUNTER — Encounter: Payer: Self-pay | Admitting: Family Medicine

## 2022-03-14 ENCOUNTER — Ambulatory Visit: Payer: Medicaid Other | Attending: Family Medicine | Admitting: Family Medicine

## 2022-03-14 VITALS — BP 105/69 | HR 85 | Temp 97.9°F | Ht 60.0 in | Wt 157.8 lb

## 2022-03-14 DIAGNOSIS — Z0000 Encounter for general adult medical examination without abnormal findings: Secondary | ICD-10-CM

## 2022-03-14 DIAGNOSIS — R5383 Other fatigue: Secondary | ICD-10-CM | POA: Diagnosis not present

## 2022-03-14 DIAGNOSIS — Z13228 Encounter for screening for other metabolic disorders: Secondary | ICD-10-CM

## 2022-03-14 DIAGNOSIS — Z975 Presence of (intrauterine) contraceptive device: Secondary | ICD-10-CM

## 2022-03-14 DIAGNOSIS — F439 Reaction to severe stress, unspecified: Secondary | ICD-10-CM

## 2022-03-14 DIAGNOSIS — Z23 Encounter for immunization: Secondary | ICD-10-CM

## 2022-03-14 NOTE — Patient Instructions (Signed)
Fatigue If you have fatigue, you feel tired all the time and have a lack of energy or a lack of motivation. Fatigue may make it difficult to start or complete tasks because of exhaustion. Occasional or mild fatigue is often a normal response to activity or life. However, long-term (chronic) or extreme fatigue may be a symptom of a medical condition such as: Depression. Not having enough red blood cells or hemoglobin in the blood (anemia). A problem with a small gland located in the lower front part of the neck (thyroid disorder). Rheumatologic conditions. These are problems related to the body's defense system (immune system). Infections, especially certain viral infections. Fatigue can also lead to negative health outcomes over time. Follow these instructions at home: Medicines Take over-the-counter and prescription medicines only as told by your health care provider. Take a multivitamin if told by your health care provider. Do not use herbal or dietary supplements unless they are approved by your health care provider. Eating and drinking  Avoid heavy meals in the evening. Eat a well-balanced diet, which includes lean proteins, whole grains, plenty of fruits and vegetables, and low-fat dairy products. Avoid eating or drinking too many products with caffeine in them. Avoid alcohol. Drink enough fluid to keep your urine pale yellow. Activity  Exercise regularly, as told by your health care provider. Use or practice techniques to help you relax, such as yoga, tai chi, meditation, or massage therapy. Lifestyle Change situations that cause you stress. Try to keep your work and personal schedules in balance. Do not use recreational or illegal drugs. General instructions Monitor your fatigue for any changes. Go to bed and get up at the same time every day. Avoid fatigue by pacing yourself during the day and getting enough sleep at night. Maintain a healthy weight. Contact a health care  provider if: Your fatigue does not get better. You have a fever. You suddenly lose or gain weight. You have headaches. You have trouble falling asleep or sleeping through the night. You feel angry, guilty, anxious, or sad. You have swelling in your legs or another part of your body. Get help right away if: You feel confused, feel like you might faint, or faint. Your vision is blurry or you have a severe headache. You have severe pain in your abdomen, your back, or the area between your waist and hips (pelvis). You have chest pain, shortness of breath, or an irregular or fast heartbeat. You are unable to urinate, or you urinate less than normal. You have abnormal bleeding from the rectum, nose, lungs, nipples, or, if you are female, the vagina. You vomit blood. You have thoughts about hurting yourself or others. These symptoms may be an emergency. Get help right away. Call 911. Do not wait to see if the symptoms will go away. Do not drive yourself to the hospital. Get help right away if you feel like you may hurt yourself or others, or have thoughts about taking your own life. Go to your nearest emergency room or: Call 911. Call the National Suicide Prevention Lifeline at 1-800-273-8255 or 988. This is open 24 hours a day. Text the Crisis Text Line at 741741. Summary If you have fatigue, you feel tired all the time and have a lack of energy or a lack of motivation. Fatigue may make it difficult to start or complete tasks because of exhaustion. Long-term (chronic) or extreme fatigue may be a symptom of a medical condition. Exercise regularly, as told by your health care provider.   Change situations that cause you stress. Try to keep your work and personal schedules in balance. This information is not intended to replace advice given to you by your health care provider. Make sure you discuss any questions you have with your health care provider. Document Revised: 10/12/2020 Document  Reviewed: 10/12/2020 Elsevier Patient Education  2023 [employer]  

## 2022-03-14 NOTE — Progress Notes (Signed)
Fatigue Increased appetite.

## 2022-03-14 NOTE — Progress Notes (Signed)
Subjective:  Patient ID: Jade Stafford, female    DOB: 1995/08/23  Age: 27 y.o. MRN: [id number]  CC: Annual Exam   HPI Jade Stafford is a 27 y.o. year old female seen with the aid of an in person interpreter for an annual visit.  Interval History:  She Complains of feeling emotional, concerned about her family in [place], less energy, increased appetite and has also had abnormal and decreased bleeding. She recently had Nexplanon placed placed 3 months ago. She has young children with 2 children under the age of 53.  Currently attending school as well to learn [place].  Does not get enough sleep.  Past Medical History:  Diagnosis Date   Medical history non-contributory     Past Surgical History:  Procedure Laterality Date   TONSILLECTOMY      Family History  Problem Relation Age of Onset   Cancer Neg Hx    Diabetes Neg Hx    Heart disease Neg Hx    Hypertension Neg Hx     Social History   Socioeconomic History   Marital status: Married    Spouse name: Not on file   Number of children: Not on file   Years of education: Not on file   Highest education level: Not on file  Occupational History   Not on file  Tobacco Use   Smoking status: Never   Smokeless tobacco: Never  Vaping Use   Vaping Use: Never used  Substance and Sexual Activity   Alcohol use: Never   Drug use: Never   Sexual activity: Yes    Birth control/protection: None  Other Topics Concern   Not on file  Social History Narrative   ** Merged History Encounter **       Social Determinants of Health   Financial Resource Strain: Not on file  Food Insecurity: Food Insecurity Present (01/06/2021)   Hunger Vital Sign    Worried About Running Out of Food in the Last Year: Sometimes true    Ran Out of Food in the Last Year: Sometimes true  Transportation Needs: No Transportation Needs (08/11/2020)   PRAPARE - Hydrologist (Medical): No    Lack of  Transportation (Non-Medical): No  Physical Activity: Not on file  Stress: Not on file  Social Connections: Not on file    Allergies  Allergen Reactions   Other Swelling    Promegrante    Outpatient Medications Prior to Visit  Medication Sig Dispense Refill   metroNIDAZOLE (METROGEL) 0.75 % vaginal gel Place 1 Applicatorful vaginally at bedtime. Apply one applicatorful to vagina at bedtime for 5 days (Patient not taking: Reported on 03/14/2022) 70 g 1   acetaminophen (TYLENOL) 500 MG tablet Take 2 tablets (1,000 mg total) by mouth every 8 (eight) hours as needed (pain). (Patient not taking: Reported on 01/06/2022) 60 tablet 0   ibuprofen (ADVIL) 600 MG tablet Take 1 tablet (600 mg total) by mouth every 6 (six) hours as needed (pain). (Patient not taking: Reported on 01/06/2022) 40 tablet 0   oxyCODONE (OXY IR/ROXICODONE) 5 MG immediate release tablet Take 1 tablet (5 mg total) by mouth every 6 (six) hours as needed for severe pain or breakthrough pain. (Patient not taking: Reported on 01/06/2022) 5 tablet 0   Prenatal 27-1 MG TABS Take 1 tablet by mouth daily. (Patient not taking: Reported on 01/06/2022) 30 tablet 11   No facility-administered medications prior to visit.  ROS Review of Systems  Constitutional:  Positive for appetite change and fatigue. Negative for activity change.  HENT:  Negative for sinus pressure and sore throat.   Respiratory:  Negative for chest tightness, shortness of breath and wheezing.   Cardiovascular:  Negative for chest pain and palpitations.  Gastrointestinal:  Negative for abdominal distention, abdominal pain and constipation.  Genitourinary: Negative.   Musculoskeletal: Negative.   Psychiatric/Behavioral:  Positive for sleep disturbance. Negative for behavioral problems and dysphoric mood.     Objective:  BP 105/69   Pulse 85   Temp 97.9 F (36.6 C) (Oral)   Ht 5' (1.524 m)   Wt 157 lb 12.8 oz (71.6 kg)   SpO2 98%   Breastfeeding No   BMI 30.82  kg/m      03/14/2022   10:49 AM 01/19/2022   11:11 AM 01/06/2022    3:29 PM  BP/Weight  Systolic BP [id number]  Diastolic BP 69 69 78  Wt. (Lbs) 157.8    BMI 30.82 kg/m2        Physical Exam Constitutional:      Appearance: She is well-developed.  Cardiovascular:     Rate and Rhythm: Normal rate.     Heart sounds: Normal heart sounds. No murmur heard. Pulmonary:     Effort: Pulmonary effort is normal.     Breath sounds: Normal breath sounds. No wheezing or rales.  Chest:     Chest wall: No tenderness.  Abdominal:     General: Bowel sounds are normal. There is no distension.     Palpations: Abdomen is soft. There is no mass.     Tenderness: There is no abdominal tenderness.  Musculoskeletal:        General: Normal range of motion.     Right lower leg: No edema.     Left lower leg: No edema.  Neurological:     Mental Status: She is alert and oriented to person, place, and time.  Psychiatric:        Mood and Affect: Mood normal.        Latest Ref Rng & Units 02/06/2020   12:08 PM  CMP  Glucose 65 - 99 mg/dL 80   BUN 6 - 20 mg/dL 10   Creatinine 0.57 - 1.00 mg/dL 0.58   Sodium 134 - 144 mmol/L 140   Potassium 3.5 - 5.2 mmol/L 4.6   Chloride 96 - 106 mmol/L 103   CO2 20 - 29 mmol/L 25   Calcium 8.7 - 10.2 mg/dL 9.0     Lipid Panel  No results found for: "CHOL", "TRIG", "HDL", "CHOLHDL", "VLDL", "LDLCALC", "LDLDIRECT"  CBC    Component Value Date/Time   WBC 11.7 (H) 01/22/2021 1451   RBC 4.23 01/22/2021 1451   HGB 12.8 01/22/2021 1451   HGB 11.7 11/05/2020 0835   HCT 37.5 01/22/2021 1451   HCT 34.8 11/05/2020 0835   PLT 242 01/22/2021 1451   PLT 210 11/05/2020 0835   MCV 88.7 01/22/2021 1451   MCV 88 11/05/2020 0835   MCH 30.3 01/22/2021 1451   MCHC 34.1 01/22/2021 1451   RDW 13.3 01/22/2021 1451   RDW 11.7 11/05/2020 0835   LYMPHSABS 1.5 07/14/2020 1015   EOSABS 0.1 07/14/2020 1015   BASOSABS 0.0 07/14/2020 1015    Lab Results  Component Value  Date   HGBA1C 5.1 07/14/2020    Assessment & Plan:  1. Annual physical exam Counseled on 150 minutes of exercise per week, healthy eating (  including decreased daily intake of saturated fats, cholesterol, routine healthcare maintenance.  - LP+Non-HDL Cholesterol - CMP14+EGFR  2. Nexplanon in place Could explain weight gain and abnormal periods and she has been reassured  3. Other fatigue She is currently getting insufficient sleep and due to the fact that she is a young mother with thyroid levels and this could be draining Advised to seek for some form of childcare so she can get some rest Will send off labs to evaluate for possible organic causes Self-care is also important - CBC with Differential/Platelet - VITAMIN D 25 Hydroxy (Vit-D Deficiency, Fractures) - T4, free - TSH - T3  4. Screening for metabolic disorder - Hemoglobin A1c  5. Psychological stress She is under constant worry due to on resting [place] Discussed the option of psychotherapy and medication and she declines both options    Health Care Maintenance: Second dose of HPV administered today No orders of the defined types were placed in this encounter.   Follow-up: Return in about 4 months (around 07/14/2022) for nurse visit, third dose HPV, 1 year PCP CPE.       Charlott Rakes, MD, FAAFP. [facility], [place]   03/14/2022, 1:04 PM

## 2022-03-15 LAB — CBC WITH DIFFERENTIAL/PLATELET
Basophils Absolute: 0 10*3/uL (ref 0.0–0.2)
Basos: 1 %
EOS (ABSOLUTE): 0.3 10*3/uL (ref 0.0–0.4)
Eos: 4 %
Hematocrit: 37.3 % (ref 34.0–46.6)
Hemoglobin: 12.3 g/dL (ref 11.1–15.9)
Immature Grans (Abs): 0 10*3/uL (ref 0.0–0.1)
Immature Granulocytes: 0 %
Lymphocytes Absolute: 2.5 10*3/uL (ref 0.7–3.1)
Lymphs: 31 %
MCH: 28.9 pg (ref 26.6–33.0)
MCHC: 33 g/dL (ref 31.5–35.7)
MCV: 88 fL (ref 79–97)
Monocytes Absolute: 0.6 10*3/uL (ref 0.1–0.9)
Monocytes: 7 %
Neutrophils Absolute: 4.6 10*3/uL (ref 1.4–7.0)
Neutrophils: 57 %
Platelets: 284 10*3/uL (ref 150–450)
RBC: 4.25 x10E6/uL (ref 3.77–5.28)
RDW: 12.2 % (ref 11.7–15.4)
WBC: 8 10*3/uL (ref 3.4–10.8)

## 2022-03-15 LAB — CMP14+EGFR
ALT: 20 IU/L (ref 0–32)
AST: 16 IU/L (ref 0–40)
Albumin/Globulin Ratio: 1.8 (ref 1.2–2.2)
Albumin: 4.1 g/dL (ref 4.0–5.0)
Alkaline Phosphatase: 66 IU/L (ref 44–121)
BUN/Creatinine Ratio: 18 (ref 9–23)
BUN: 16 mg/dL (ref 6–20)
Bilirubin Total: <0.2 mg/dL (ref 0.0–1.2)
CO2: 20 mmol/L (ref 20–29)
Calcium: 8.7 mg/dL (ref 8.7–10.2)
Chloride: 106 mmol/L (ref 96–106)
Creatinine, Ser: 0.89 mg/dL (ref 0.57–1.00)
Globulin, Total: 2.3 g/dL (ref 1.5–4.5)
Glucose: 89 mg/dL (ref 70–99)
Potassium: 4.4 mmol/L (ref 3.5–5.2)
Sodium: 139 mmol/L (ref 134–144)
Total Protein: 6.4 g/dL (ref 6.0–8.5)
eGFR: 91 mL/min/{1.73_m2} (ref >59)

## 2022-03-15 LAB — LP+NON-HDL CHOLESTEROL
Cholesterol, Total: 131 mg/dL (ref 100–199)
HDL: 54 mg/dL (ref >39)
LDL Chol Calc (NIH): 67 mg/dL (ref 0–99)
Total Non-HDL-Chol (LDL+VLDL): 77 mg/dL (ref 0–129)
Triglycerides: 41 mg/dL (ref 0–149)
VLDL Cholesterol Cal: 10 mg/dL (ref 5–40)

## 2022-03-15 LAB — VITAMIN D 25 HYDROXY (VIT D DEFICIENCY, FRACTURES): Vit D, 25-Hydroxy: 12.3 ng/mL — ABNORMAL LOW (ref 30.0–100.0)

## 2022-03-15 LAB — TSH: TSH: 1.65 u[IU]/mL (ref 0.450–4.500)

## 2022-03-15 LAB — HEMOGLOBIN A1C
Est. average glucose Bld gHb Est-mCnc: 111 mg/dL
Hgb A1c MFr Bld: 5.5 % (ref 4.8–5.6)

## 2022-03-15 LAB — T4, FREE: Free T4: 1.23 ng/dL (ref 0.82–1.77)

## 2022-03-15 LAB — T3: T3, Total: 141 ng/dL (ref 71–180)

## 2022-03-17 ENCOUNTER — Other Ambulatory Visit: Payer: Self-pay

## 2022-03-17 ENCOUNTER — Other Ambulatory Visit: Payer: Self-pay | Admitting: Family Medicine

## 2022-03-17 MED ORDER — ERGOCALCIFEROL 1.25 MG (50000 UT) PO CAPS
50000.0000 [IU] | ORAL_CAPSULE | ORAL | 1 refills | Status: AC
  Filled 2022-03-17: qty 12, 84d supply, fill #0

## 2022-03-23 ENCOUNTER — Other Ambulatory Visit: Payer: Self-pay

## 2022-06-07 ENCOUNTER — Encounter: Payer: Self-pay | Admitting: Family Medicine

## 2022-06-07 DIAGNOSIS — E559 Vitamin D deficiency, unspecified: Secondary | ICD-10-CM | POA: Insufficient documentation

## 2022-07-14 ENCOUNTER — Ambulatory Visit: Payer: Medicaid Other

## 2022-08-05 IMAGING — US US MFM OB FOLLOW-UP
1 series · 14 of 28 positions shown · non-contrast
Comparison: none

[Series 1: us mfm ob follow-up · 69 acquisitions, 14 frames shown]
[im 3/69]
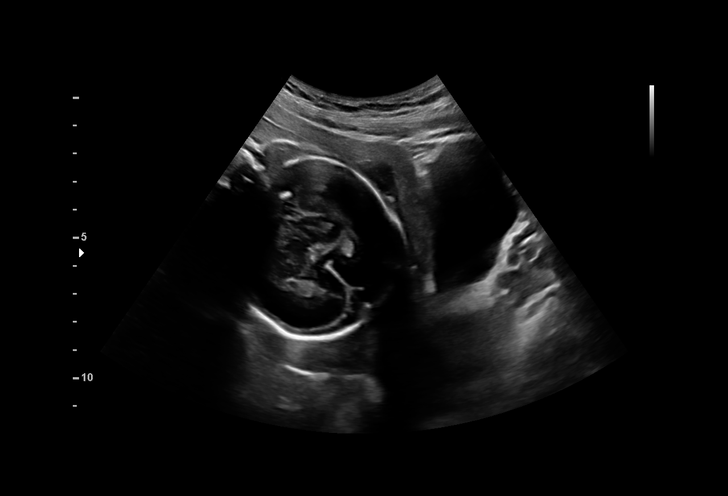
[im 8/69]
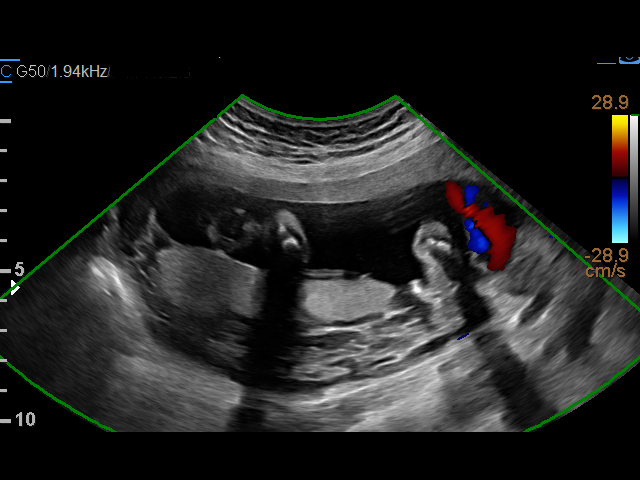
[im 13/69]
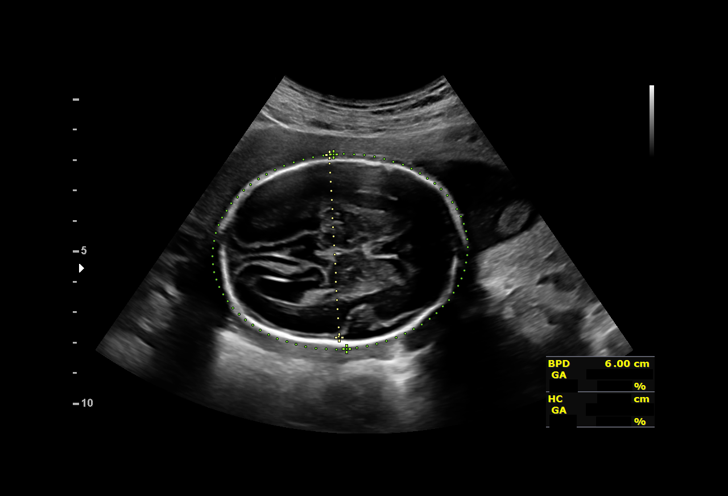
[im 18/69]
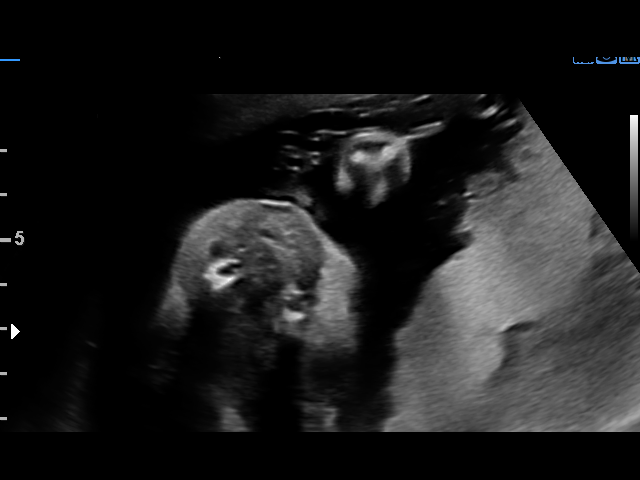
[im 23/69]
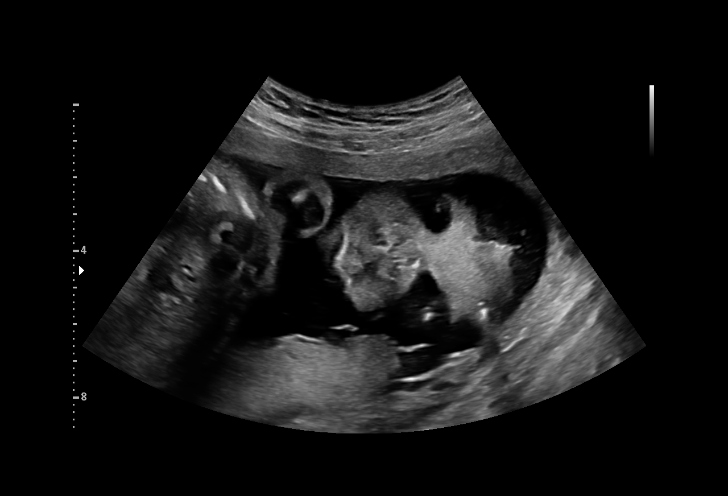
[im 28/69]
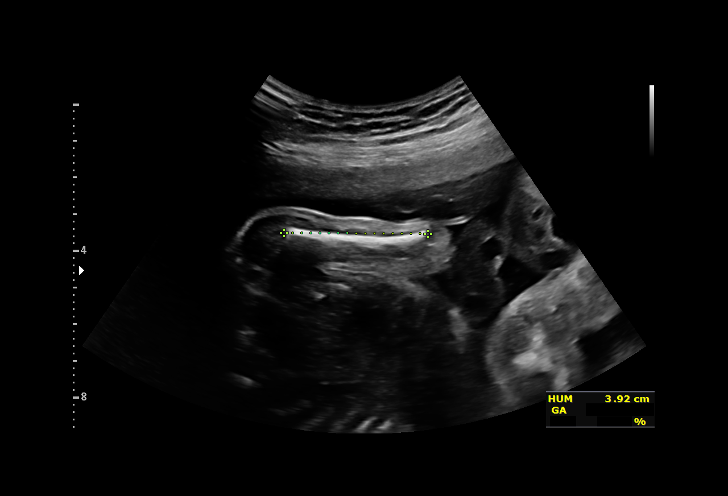
[im 33/69]
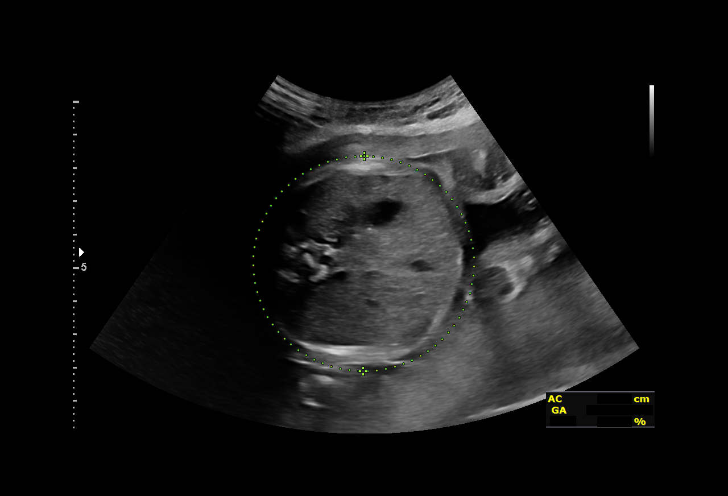
[im 38/69]
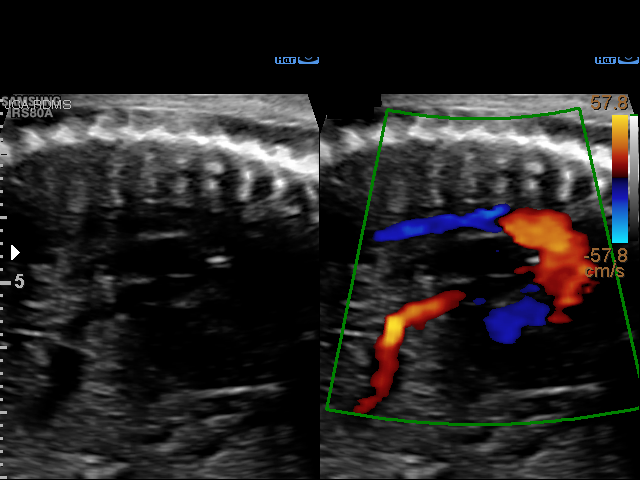
[im 43/69]
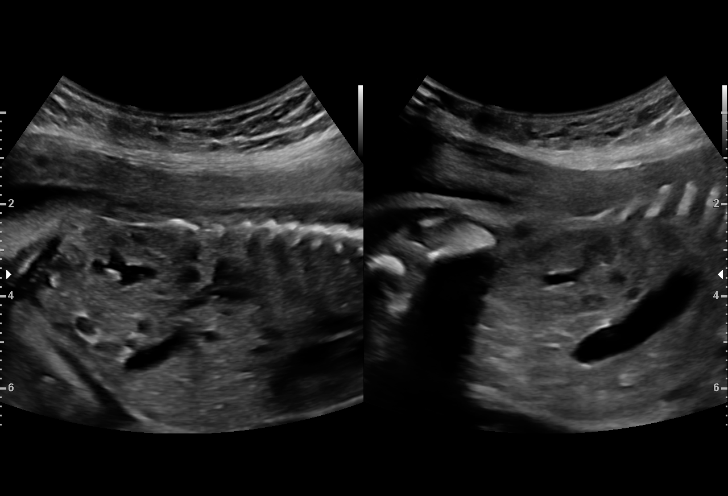
[im 48/69]
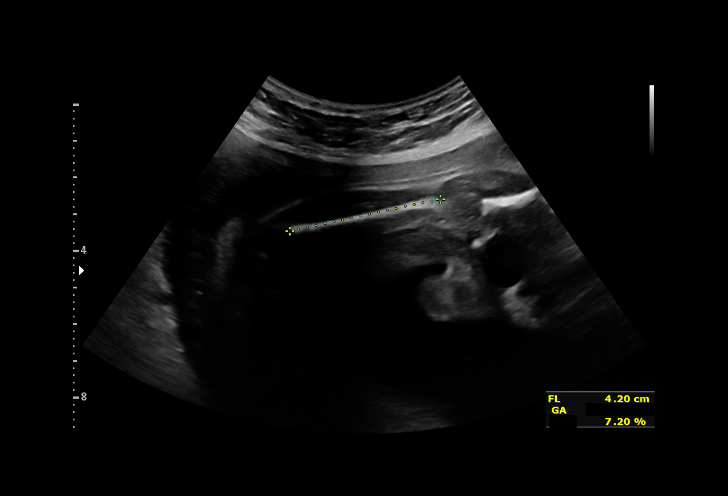
[im 53/69]
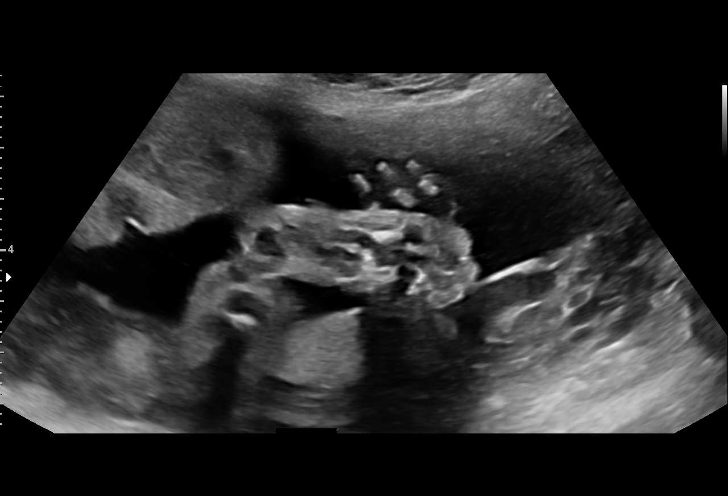
[im 58/69]
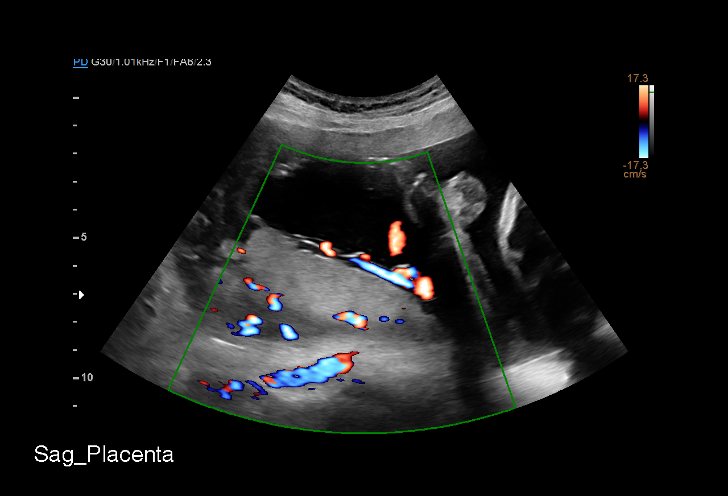
[im 63/69]
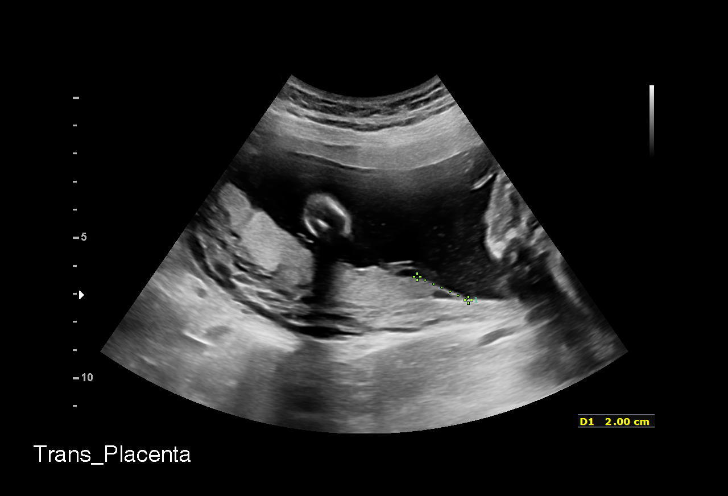
[im 69/69]
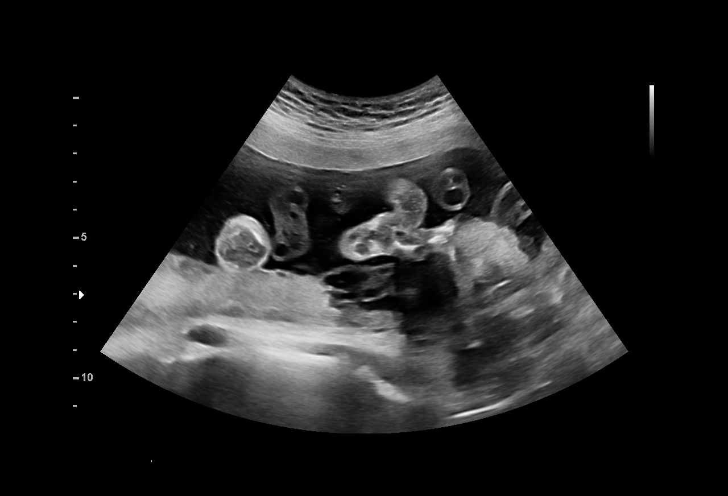

[14 of 28 positions shown; findings below may reference images not displayed]

[facility] [HOSPITAL]
                   SHAMEEKA CNM

                                                      [facility]

Indications

 Marginal insertion of umbilical cord affecting
 management of mother in second trimester
 25 weeks gestation of pregnancy
 Encounter for antenatal screening for
 malformations
Fetal Evaluation

 Num Of Fetuses:         1
 Fetal Heart Rate(bpm):  140
 Cardiac Activity:       Observed
 Presentation:           Cephalic
 Placenta:               Posterior
 P. Cord Insertion:      Marginal insertion

 Amniotic Fluid
 AFI FV:      Within normal limits

                             Largest Pocket(cm)

Biometry

 BPD:      60.1  mm     G. Age:  24w 4d         25  %    CI:         68.5   %    70 - 86
                                                         FL/HC:      18.3   %    18.7 -
 HC:      232.1  mm     G. Age:  25w 2d         37  %    HC/AC:      1.11        1.04 -
 AC:      209.4  mm     G. Age:  25w 3d         57  %    FL/BPD:     70.7   %    71 - 87
 FL:       42.5  mm     G. Age:  23w 6d         10  %    FL/AC:      20.3   %    20 - 24
 HUM:      38.9  mm     G. Age:  23w 6d         15  %
 LV:        6.2  mm

 Est. FW:     739  gm    1 lb 10 oz      32  %
OB History

 Blood Type:   A+
 Gravidity:    3         Term:   2
Gestational Age

 LMP:           24w 3d        Date:  04/25/20                 EDD:   01/30/21
 U/S Today:     24w 6d                                        EDD:   01/27/21
 Best:          25w 0d     Det. By:  Early Ultrasound         EDD:   01/26/21
                                     (06/17/20)
Anatomy

 Cranium:               Appears normal         LVOT:                   Appears normal
 Cavum:                 Previously seen        Aortic Arch:            Appears normal
 Ventricles:            Appears normal         Ductal Arch:            Appears normal
 Choroid Plexus:        Previously seen        Diaphragm:              Appears normal
 Cerebellum:            Previously seen        Stomach:                Appears normal, left
                                                                       sided
 Posterior Fossa:       Previously seen        Abdomen:                Appears normal
 Nuchal Fold:           Not applicable (>20    Abdominal Wall:         Previously seen
                        wks GA)
 Face:                  Orbits and profile     Cord Vessels:           Previously seen
                        previously seen
 Lips:                  Appears normal         Kidneys:                Appear normal
 Palate:                Appears normal         Bladder:                Appears normal
 Thoracic:              Appears normal         Spine:                  Previously seen
                        Appears normal
 Heart:                 Appears normal         Upper Extremities:      Appears normal
                        (4CH, axis, and
                        situs)
 RVOT:                  Previously seen        Lower Extremities:      Appears normal

 Other:  Fetus appears to be a male. VC, 3VV and 3VTV prev visualized.
Cervix Uterus Adnexa

 Cervix
 Length:           2.94  cm.
 Normal appearance by transabdominal scan.
Impression

 Amniotic fluid is normal and good fetal activity is seen .Fetal
 growth is appropriate for gestational age .

 We confirmed marginal cord insertion again. No evidence of
 velamentous cord insertion.

 I explained the diagnosis with help of diagrams.

 Patient was counseled with help of language interpreter
 present in the room.
Recommendations

 -An appointment was made for her to return in 4 weeks for
 fetal growth assessment.
                 Sha Aibu, Past

## 2023-03-14 ENCOUNTER — Encounter: Payer: Self-pay | Admitting: Family Medicine

## 2023-03-14 ENCOUNTER — Ambulatory Visit

## 2023-03-14 DIAGNOSIS — Z23 Encounter for immunization: Secondary | ICD-10-CM | POA: Diagnosis not present

## 2023-03-14 DIAGNOSIS — Z719 Counseling, unspecified: Secondary | ICD-10-CM

## 2023-03-14 NOTE — Progress Notes (Signed)
 In nurse clinic with husband for immunizations as needed for immigration.Presents vaccine record from [place], [place] and these were placed into [facility].  Counseled on all recommended immunizations.  Immunizations given today: Heplisav B, MMR, Varicella, Polio, HPV. Tolerated well. Updated [facility] copy given and recommended schedule explained. [facility] interpreter (Dari lang) [phone]. Jerel Shepherd, RN
